# Patient Record
Sex: Female | Born: 1965
Health system: Southern US, Community
[De-identification: ages and names within clinical notes are randomized; demographics above are authoritative.]

## PROBLEM LIST (undated history)

## (undated) DIAGNOSIS — D649 Anemia, unspecified: Secondary | ICD-10-CM

## (undated) DIAGNOSIS — N950 Postmenopausal bleeding: Secondary | ICD-10-CM

## (undated) DIAGNOSIS — N840 Polyp of corpus uteri: Secondary | ICD-10-CM

---

## 2012-08-31 ENCOUNTER — Emergency Department (HOSPITAL_COMMUNITY): Payer: Self-pay

## 2012-08-31 ENCOUNTER — Emergency Department (HOSPITAL_COMMUNITY)
Admission: EM | Admit: 2012-08-31 | Discharge: 2012-08-31 | Disposition: A | Discharge status: Home / Self Care | Payer: Self-pay | Attending: Emergency Medicine | Admitting: Emergency Medicine

## 2012-08-31 ENCOUNTER — Encounter (HOSPITAL_COMMUNITY): Payer: Self-pay

## 2012-08-31 DIAGNOSIS — Y9301 Activity, walking, marching and hiking: Secondary | ICD-10-CM | POA: Insufficient documentation

## 2012-08-31 DIAGNOSIS — W010XXA Fall on same level from slipping, tripping and stumbling without subsequent striking against object, initial encounter: Secondary | ICD-10-CM | POA: Insufficient documentation

## 2012-08-31 DIAGNOSIS — D649 Anemia, unspecified: Secondary | ICD-10-CM | POA: Insufficient documentation

## 2012-08-31 DIAGNOSIS — Z79899 Other long term (current) drug therapy: Secondary | ICD-10-CM | POA: Insufficient documentation

## 2012-08-31 DIAGNOSIS — S8010XA Contusion of unspecified lower leg, initial encounter: Secondary | ICD-10-CM | POA: Insufficient documentation

## 2012-08-31 DIAGNOSIS — S8011XA Contusion of right lower leg, initial encounter: Secondary | ICD-10-CM

## 2012-08-31 DIAGNOSIS — Y929 Unspecified place or not applicable: Secondary | ICD-10-CM | POA: Insufficient documentation

## 2012-08-31 DIAGNOSIS — X500XXA Overexertion from strenuous movement or load, initial encounter: Secondary | ICD-10-CM | POA: Insufficient documentation

## 2012-08-31 HISTORY — DX: Anemia, unspecified: D64.9

## 2012-08-31 NOTE — ED Provider Notes (Signed)
CSN: [id number]     Arrival date & time 08/31/12  1034 History  First MD Initiated Contact with Patient 08/31/12 1049     Chief Complaint  Patient presents with  . Fall  . Leg Pain    Left lower leg and foot    HPI Comments: The patient was walking Thursday last week when she actually stumbled and tripped and fell. The nursing notes indicate that the patient injured her left leg however it is her right leg that is affected.  Patient states she stumbled and twisted her right ankle. She also landed directly against her right shin. Patient states she's had noted some bruising and swelling. She was trying to keep it elevated and thought that the pain and swelling would reduce. It has not really gotten any better. Patient denies any numbness or weakness. She denies any other injuries.  Patient is a 47 y.o. female presenting with fall and leg pain. The history is provided by the patient.  Fall This is a new problem. The problem occurs constantly. The problem has not changed since onset. Leg Pain   Past Medical History  Diagnosis Date  . Anemia    History reviewed. No pertinent past surgical history. History reviewed. No pertinent family history. History  Substance Use Topics  . Smoking status: Never Smoker   . Smokeless tobacco: Never Used  . Alcohol Use: 0.6 oz/week    1 Cans of beer per week     Comment: Pt states one beer once every 4 months   OB History   Grav Para Term Preterm Abortions TAB SAB Ect Mult Living                 Review of Systems  All other systems reviewed and are negative.    Allergies  Review of patient's allergies indicates no known allergies.  Home Medications   Current Outpatient Rx  Name  Route  Sig  Dispense  Refill  . ferrous sulfate 220 (44 FE) MG/5ML solution   Oral   Take 220 mg by mouth every other day.         . ibuprofen (ADVIL,MOTRIN) 200 MG tablet   Oral   Take 200 mg by mouth once.          BP 127/88  Pulse 65  Temp(Src)  98.9 F (37.2 C) (Oral)  Resp 18  SpO2 100%  LMP 08/28/2012 Physical Exam  Nursing note and vitals reviewed. Constitutional: She appears well-developed and well-nourished. No distress.  HENT:  Head: Normocephalic and atraumatic.  Right Ear: External ear normal.  Left Ear: External ear normal.  Eyes: Conjunctivae are normal. Right eye exhibits no discharge. Left eye exhibits no discharge. No scleral icterus.  Neck: Neck supple. No tracheal deviation present.  Cardiovascular: Normal rate.   Pulmonary/Chest: Effort normal. No stridor. No respiratory distress.  Musculoskeletal: She exhibits no edema.       Right ankle: Normal. She exhibits normal range of motion and no swelling.       Right lower leg: She exhibits bony tenderness and swelling.       Right foot: She exhibits tenderness and bony tenderness. She exhibits no swelling, no deformity and no laceration.  Ecchymosis noted the right anterior tib-fib region, no gross deformity, or edema; tenderness to palpation midfoot, no medial or lateral malleolar tenderness; no proximal fibula tenderness; distal neurovascular intact  Neurological: She is alert. Cranial nerve deficit: no gross deficits.  Skin: Skin is warm and dry. No  rash noted.  Psychiatric: She has a normal mood and affect.    ED Course   Procedures (including critical care time)  Labs Reviewed - No data to display Dg Tibia/fibula Right  08/31/2012   *RADIOLOGY REPORT*  Clinical Data: Fall.  Right lower leg pain.  RIGHT TIBIA AND FIBULA - 2 VIEW  Comparison: None.  Findings: Imaged bones, joints and soft tissues appear normal.  IMPRESSION: Negative exam.   Original Report Authenticated By: Holley Dexter, M.D.   Dg Foot Complete Right  08/31/2012   *RADIOLOGY REPORT*  Clinical Data: Fall with bruising around the ankle.  RIGHT FOOT COMPLETE - 3+ VIEW  Comparison: None.  Findings: Hallux valgus.  Minimal first metatarsal phalangeal joint osteoarthritis.  No definite  fracture.  Small calcaneal spurs.  IMPRESSION: No acute findings.   Original Report Authenticated By: Leanna Battles, M.D.   1. Contusion of leg, right, initial encounter     MDM  No sign of break or dislocation. Patient be placed in a splint for comfort.  Followup with orthopedist as needed if not improving within the week  Celene Kras, MD 08/31/12 1250

## 2012-08-31 NOTE — ED Notes (Signed)
Last Thursday 8/14, pt was walking down stairs, tripped and fell. Pt stated landed on left leg and immediantly began to swell. Swelling occurred in left lower portion including ankle and foot. Pt stated she has iced it off and on since Thursday and swelling has not gone down. Pt rates pain 0/10 when sitting and 7/10 when walking or pressure applied.

## 2013-08-11 ENCOUNTER — Emergency Department (HOSPITAL_COMMUNITY)
Admission: EM | Admit: 2013-08-11 | Discharge: 2013-08-11 | Disposition: A | Discharge status: Home / Self Care | Payer: Self-pay | Attending: Emergency Medicine | Admitting: Emergency Medicine

## 2013-08-11 ENCOUNTER — Encounter (HOSPITAL_COMMUNITY): Payer: Self-pay | Admitting: Emergency Medicine

## 2013-08-11 DIAGNOSIS — Y9289 Other specified places as the place of occurrence of the external cause: Secondary | ICD-10-CM | POA: Insufficient documentation

## 2013-08-11 DIAGNOSIS — Z23 Encounter for immunization: Secondary | ICD-10-CM | POA: Insufficient documentation

## 2013-08-11 DIAGNOSIS — Y9389 Activity, other specified: Secondary | ICD-10-CM | POA: Insufficient documentation

## 2013-08-11 DIAGNOSIS — Z79899 Other long term (current) drug therapy: Secondary | ICD-10-CM | POA: Insufficient documentation

## 2013-08-11 DIAGNOSIS — Z862 Personal history of diseases of the blood and blood-forming organs and certain disorders involving the immune mechanism: Secondary | ICD-10-CM | POA: Insufficient documentation

## 2013-08-11 DIAGNOSIS — S0120XA Unspecified open wound of nose, initial encounter: Secondary | ICD-10-CM | POA: Insufficient documentation

## 2013-08-11 DIAGNOSIS — W268XXA Contact with other sharp object(s), not elsewhere classified, initial encounter: Secondary | ICD-10-CM | POA: Insufficient documentation

## 2013-08-11 DIAGNOSIS — S0181XA Laceration without foreign body of other part of head, initial encounter: Secondary | ICD-10-CM

## 2013-08-11 MED ORDER — HYDROCODONE-ACETAMINOPHEN 5-325 MG PO TABS: 1.0000 | ORAL_TABLET | ORAL | Status: DC | PRN

## 2013-08-11 MED ORDER — AMOXICILLIN-POT CLAVULANATE 875-125 MG PO TABS: 1.0000 | ORAL_TABLET | Freq: Two times a day (BID) | ORAL | Status: DC

## 2013-08-11 MED ORDER — TETANUS-DIPHTH-ACELL PERTUSSIS 5-2.5-18.5 LF-MCG/0.5 IM SUSP
0.5000 mL | Freq: Once | INTRAMUSCULAR | Status: AC
  Administered 2013-08-11: 0.5 mL via INTRAMUSCULAR
  Filled 2013-08-11: qty 0.5

## 2013-08-11 MED ORDER — OXYCODONE-ACETAMINOPHEN 5-325 MG PO TABS
1.0000 | ORAL_TABLET | Freq: Once | ORAL | Status: AC
  Administered 2013-08-11: 1 via ORAL
  Filled 2013-08-11: qty 1

## 2013-08-11 MED ORDER — IBUPROFEN 800 MG PO TABS: 800.0000 mg | ORAL_TABLET | Freq: Three times a day (TID) | ORAL | Status: DC

## 2013-08-11 NOTE — ED Notes (Signed)
Consulting physician at bedside.

## 2013-08-11 NOTE — ED Provider Notes (Signed)
CSN: [id number]     Arrival date & time 08/11/13  1354 History   This chart was scribed for Harvie Heck, PA-C working with Richarda Blade, MD by Randa Evens, ED Scribe. This patient was seen in room TR10C/TR10C and the patient's care was started at 4:03 PM.      Chief Complaint  Patient presents with  . Facial Laceration   HPI Comments: Katherine Hicks is a 48 y.o. female who presents to the Emergency Department complaining of face laceration onset 2 HR prior to arrival. She states she is having an associated headache. She states she walked in to a piece of metal hanging off the back of a friends truck. She states her tetanus was in the 1980's. She denies LOC Pt is also asking for ibuprofen for unrelated neck pain.   The history is provided by the patient. No language interpreter was used.    Past Medical History  Diagnosis Date  . Anemia    History reviewed. No pertinent past surgical history. No family history on file. History  Substance Use Topics  . Smoking status: Never Smoker   . Smokeless tobacco: Never Used  . Alcohol Use: 0.6 oz/week    1 Cans of beer per week     Comment: Pt states one beer once every 4 months   OB History   Grav Para Term Preterm Abortions TAB SAB Ect Mult Living                 Review of Systems  Skin: Positive for wound.  Neurological: Positive for headaches. Negative for syncope.    Allergies  Review of patient's allergies indicates no known allergies.  Home Medications   Prior to Admission medications   Medication Sig Start Date End Date Taking? Authorizing Provider  Multiple Vitamin (MULTIVITAMIN WITH MINERALS) TABS tablet Take 1 tablet by mouth daily.   Yes Historical Provider, MD   Triage Vitals: BP 129/80  Pulse 56  Temp(Src) 98.3 F (36.8 C) (Oral)  Ht 5\' 6"  (1.676 m)  Wt 170 lb (77.111 kg)  BMI 27.45 kg/m2  SpO2 100%  Physical Exam  Nursing note and vitals reviewed. Constitutional: She is oriented to person,  place, and time. She appears well-developed and well-nourished.  Non-toxic appearance. She does not have a sickly appearance. She does not appear ill. No distress.  HENT:  Head: Normocephalic.  Nose: Nose lacerations present.  2.5 cm laceration to the Supratip break with the tip/apex slightly depressed. Minimal bleeding no obvious FB, Inspection of nare does not show through and through laceration.  Eyes: Conjunctivae and EOM are normal. Pupils are equal, round, and reactive to light.  Neck: Normal range of motion. Neck supple.  Pulmonary/Chest: Effort normal. No respiratory distress.  Musculoskeletal: Normal range of motion.  Neurological: She is alert and oriented to person, place, and time.  Skin: Skin is warm and dry. She is not diaphoretic.  Psychiatric: She has a normal mood and affect. Her behavior is normal.    ED Course  LACERATION REPAIR Date/Time: 08/11/2013 6:14 PM Performed by: Lorrine Kin Authorized by: Lorrine Kin Consent: Verbal consent obtained. Risks and benefits: risks, benefits and alternatives were discussed Consent given by: patient Patient understanding: patient states understanding of the procedure being performed Patient consent: the patient's understanding of the procedure matches consent given Required items: required blood products, implants, devices, and special equipment available Patient identity confirmed: verbally with patient Body area: head/neck Location details: nose Laceration length: 3  cm Foreign bodies: unknown (Small amount of dirt) Tendon involvement: none Anesthesia: local infiltration Local anesthetic: lidocaine 2% without epinephrine Anesthetic total: 4 ml Patient sedated: no Preparation: Patient was prepped and draped in the usual sterile fashion. Irrigation solution: saline Irrigation method: syringe Amount of cleaning: standard Skin closure: 6-0 Prolene Number of sutures: 4 Technique: simple Approximation:  close Approximation difficulty: simple Patient tolerance: Patient tolerated the procedure well with no immediate complications.   (including critical care time) DIAGNOSTIC STUDIES: Oxygen Saturation is 100% on RA, normal by my interpretation.    COORDINATION OF CARE: 4:10 PM-Discussed treatment plan which includes laceration repair with pt at bedside and pt agreed to plan.     Labs Review Labs Reviewed - No data to display  Imaging Review No results found.   EKG Interpretation None      MDM   Final diagnoses:  Facial laceration, initial encounter   Patient presents with laceration to the nose with minimal displacement. Discussed with Dr. Eulis Foster also evaluated patient in the emergency department and advises call to maxillofacial for recommendations on repair in the ED first stable repair here. Patient's last tetanus was in 1980, updated today. 1655 discussed patient history, laceration with Dr. Janace Hoard who agrees to evaluate the patient in the emergency department for recommendations and closure the laceration. Pt tolerated laceration repair without complaints. Meds given in ED:  Medications  Tdap (BOOSTRIX) injection 0.5 mL (0.5 mLs Intramuscular Given 08/11/13 1624)  oxyCODONE-acetaminophen (PERCOCET/ROXICET) 5-325 MG per tablet 1 tablet (1 tablet Oral Given 08/11/13 1624)    Discharge Medication List as of 08/11/2013  6:35 PM    START taking these medications   Details  amoxicillin-clavulanate (AUGMENTIN) 875-125 MG per tablet Take 1 tablet by mouth 2 (two) times daily., Starting 08/11/2013, Until Discontinued, Print    HYDROcodone-acetaminophen (NORCO/VICODIN) 5-325 MG per tablet Take 1 tablet by mouth every 4 (four) hours as needed., Starting 08/11/2013, Until Discontinued, Print    ibuprofen (ADVIL,MOTRIN) 800 MG tablet Take 1 tablet (800 mg total) by mouth 3 (three) times daily., Starting 08/11/2013, Until Discontinued, Print       I personally performed the services  described in this documentation, which was scribed in my presence. The recorded information has been reviewed and is accurate.     Lorrine Kin, PA-C 08/12/13 [phone]

## 2013-08-11 NOTE — ED Notes (Signed)
Patient states she ran into a piece of corrugated sheet metal and it cut open her nose.   Patient states medium size laceration on top of nose.  Patient has gauze that was placed by EMS on nose.  Bleeding controlled at this time.

## 2013-08-11 NOTE — Discharge Instructions (Signed)
Call Dr. Janace Hoard for further evaluation and suture removal in 5 days. Call for a follow up appointment with a Family or Primary Care Provider.  Return if Symptoms worsen.   Take medication as prescribed.  Keep the wound clean and dry.  Do not soak the wound.    Emergency Department Resource Guide 1) Find a Doctor and Pay Out of Pocket Although you won't have to find out who is covered by your insurance plan, it is a good idea to ask around and get recommendations. You will then need to call the office and see if the doctor you have chosen will accept you as a new patient and what types of options they offer for patients who are self-pay. Some doctors offer discounts or will set up payment plans for their patients who do not have insurance, but you will need to ask so you aren't surprised when you get to your appointment.  2) Contact Your Local Health Department Not all health departments have doctors that can see patients for sick visits, but many do, so it is worth a call to see if yours does. If you don't know where your local health department is, you can check in your phone book. The CDC also has a tool to help you locate your state's health department, and many state websites also have listings of all of their local health departments.  3) Find a [facility] If your illness is not likely to be very severe or complicated, you may want to try a walk in clinic. These are popping up all over the country in pharmacies, drugstores, and shopping centers. They're usually staffed by nurse practitioners or physician assistants that have been trained to treat common illnesses and complaints. They're usually fairly quick and inexpensive. However, if you have serious medical issues or chronic medical problems, these are probably not your best option.  No Primary Care Doctor: - Call Health Connect at  [phone] - they can help you locate a primary care doctor that  accepts your insurance, provides certain  services, etc. - Physician Referral Service- [phone]  Chronic Pain Problems: Organization         Address  Phone   Notes  [facility]  [phone] Patients need to be referred by their primary care doctor.   Medication Assistance: Organization         Address  Phone   Notes  [facility] Medication [facility] [address] [place], [place] [address] [phone] --Must be a resident of [facility] -- Must have NO insurance coverage whatsoever (no Medicaid/ Medicare, etc.) -- The pt. MUST have a primary care doctor that directs their care regularly and follows them in the community   MedAssist  [phone]   [employer]  [phone]    Agencies that provide inexpensive medical care: Organization         Address  Phone   Notes  [place]  [phone]   [facility]    [phone]   [facility] [place], [place] [address] [phone]   [place] [address] [place] [phone]   Planned Parenthood    [phone]   [facility]    [phone]   [place] and [address], [place] Phone:  [phone], Fax:  [phone] Hours of Operation:  9  am - 6 pm, M-F.  Also accepts Medicaid/Medicare and self-pay.  [facility] for [place] [address], [address], [place] Phone: [phone], Fax: [phone]. Hours of Operation:  8:30 am - 5:30 pm, M-F.  Also accepts Medicaid and self-pay.  [facility] [place] [address] [place] Phone: [phone]   [place], [place], [place] [phone], Ext. [phone] Mondays & Thursdays: 7-9 AM.  First 15 patients are seen on a first come, first serve basis.    [place] Providers:  Organization         Address  Phone   Notes  [facility] [address], [place] [phone] Also accepts self-pay patients.  [facility] [place] [address], [place]  [phone]   [place], [address], [place] [phone]   [facility] [address] [place] [phone]   Lucianne Lei [address], [place]   [phone] Only accepts [place] Access [place] patients after they have their name applied to their card.   Self-Pay (no insurance) in [facility]:  Organization         Address  Phone   Notes  Sickle Cell Patients, [facility] [place] [phone]   [facility] [place] [phone]   [facility] [place]  [place], [place], [place] [phone]   [facility]/Dr. Osei-Bonsu  [address], [place] or [address], [place] [phone] Phone number for both [place] and [place] locations is the same.  [facility] [address] [place] [phone]   [facility] [address], [place] or [address] [phone]   [facility] [address], [place] [phone], phone; [phone], fax Sees patients 1st and 3rd Saturday of every month.  Must not qualify for public or private insurance (i.e. Medicaid, Medicare, [place] Health Choice, Veterans' Benefits)  Household income should be no more than 200% of the poverty level The clinic cannot treat you if you are pregnant or think you are pregnant  Sexually transmitted diseases are not treated at the clinic.    Dental Care: Organization         Address  Phone  Notes  [facility] [place] [phone] Accepts  children up to age 31 who are enrolled in [place] or [place]; pregnant women with a Medicaid card; and children who have applied for Medicaid or [place] Health Choice, but were declined, whose parents can pay a reduced fee at time of service.  [facility]  [address], [place] [phone] Accepts children up to age 63 who are enrolled in [place] or [place]; pregnant women with a Medicaid card; and children who have applied for Medicaid or [place] Health Choice, but were declined, whose parents can pay a reduced fee at time of service.  [place] Adult Dental Access PROGRAM  [place] [phone] Patients are seen by appointment only. Walk-ins are not accepted. [place] will see patients 18 years of  age and older. Monday - Tuesday (8am-5pm) Most Wednesdays (8:30-5pm) $30 per visit, cash only  [facility] Adult Dental Access PROGRAM  [address], [facility] [phone] Patients are seen by appointment only. Walk-ins are not accepted. [place] will see patients 61 years of age and older. One Wednesday Evening (Monthly: Volunteer Based).  $30 per visit, cash only  [place]  [phone] for adults; Children under age 52, call Graduate Pediatric Dentistry at [phone]. Children aged 59-14, please call [phone] to request a pediatric application.  Dental services are provided in all areas of dental care including fillings, crowns and bridges, complete and partial dentures, implants, gum treatment, root canals, and extractions. Preventive care is also provided. Treatment is provided to both adults and children. Patients are selected via a lottery and there is often a waiting list.   [facility] [address] [place]  [phone] [email or web address]   [facility] [address] [place], [place] [phone], Ext. [phone] Second and  Fourth Thursday of each month, opens at 6:30 AM; Clinic ends at 9 AM.  Patients are seen on a first-come first-served basis, and a limited number are seen during each clinic.   [facility]  [address] Hillard Danker [place], [place] [phone]   Eligibility Requirements You must have lived in [place], [place], or [place] counties for at least the last three months.   You cannot be eligible for state or federal sponsored [employer], including [employer], [place], or [employer].   You generally cannot be eligible for healthcare insurance through your employer.    How to apply: Eligibility screenings are held every Tuesday and Wednesday afternoon from 1:00 pm until 4:00 pm. You do not need an appointment for the interview!  [facility] [address] [place], [place]   [facility]  [place] Department  [place]  [phone]    Behavioral Health Resources in the Community: Intensive Outpatient Programs Organization         Address  Phone  Notes  [place]. [address], [place], [place] [phone]   [facility] [address], [place], [place]   ADS: Alcohol & Drug Svcs [address], [place], [place]   [place] [address]  [place], [place] or [phone]   Substance Abuse Resources Organization         Address  Phone  Notes  Alcohol and Drug Services  [phone]   [place]  [phone]   [facility] [place]   Chinita Pester  [phone]   Residential & Outpatient Substance Abuse Program  [phone]   Psychological Services Organization         Address  Phone  Notes  [facility] [place]  [place]  [phone]   [place]  [address], [place] or [phone]    Mobile Crisis Teams Organization         Address  Phone  Notes  [facility], Mobile Crisis Care Unit  [phone]   Assertive Psychotherapeutic Services  [address] [place], [place]   [facility] [address] [place] [phone]    Self-Help/Support Groups Organization  Address  Phone             Notes  [place]. of [place] - variety of support groups  [place] Call for more information  Narcotics Anonymous (NA), [facility] [address], [employer] [place]  2 meetings at this location   Special educational needs teacher         Address  Phone  Notes  [facility] Residential [facility],    [place]  [phone]   [facility]  [address], [place] [phone], [place], [place]   [place], [place] [phone] Admissions: 8am-3pm M-F  Incentives Substance [place] [address]    [place], [place] [phone]   [facility] [address] [place], [place], [place]   The [facility] [address],  [place], [place]   [facility] Programs - Intensive Outpatient [address] Kristeen [address], [place], [place]   [facility] ([place] [address]  [place], [place] [phone] or [phone]   [facility] Services (RTS) [address] [place], [place] Accepts Medicaid  [facility] [place] [address]  [place] [phone] Substance Abuse/Addiction Treatment   [facility] Organization         Address  Phone  Notes  [facility]  [phone]   Domenic Schwab, PhD [address] [place], [place]   [phone] or [phone]   [place], [place] [phone]   [facility] [address], [place], [place] [phone] Insurance/Medicaid/sponsorship through [facility] and Families [address], [address] [place]                                    [place], [place] [phone] [place] [address], [place] [phone]    Dr. Adele Schilder  [phone]   [facility] [place] Dept. 1) [address] [place] 2) [place] 3)  [place] [address], [place] [phone]  [phone]   [place] [phone] or [phone] (After Hours)

## 2013-08-11 NOTE — Consult Note (Signed)
  Asked to see the patient for a nasal laceration for recommendation of any need for exploration of the wound for cartilage injury . Patient had an injury today running into some corrugated metal roofing and cut her nose on the dorsum just posterior to the dome. She had some bleeding but that has currently stopped. She had no bleeding from the inside of the nose. She has no new nasal obstruction. The wound is for the most part approximated. Her pain is controlled. She has no other injuries. Examination: Awake and alert. Nose the dorsum has a transverse wound that is approximately 1 cm. His just behind the dome of the cartilage of the lower lateral cartilages. It looks like it's posterior to these cartilages. It is not clear as to whether it  the injured the superior aspect of the cartilaginous septum. There is no evidence of injury inside the nose and no blood. No swelling. The septum is midline. The nose appears to have good columellar support. Oral cavity/oropharynx-no lesions. Neck without adenopathy. Nasal laceration-this does not appear to have any obvious complexity to the injury requiring further intervention such as exploration of the cartilage. The wound is fairly well approximated already and I would recommend closure with chromic and then possibly Dermabond. She should followup in the office in about 5-7 days. It is unlikely that any further intervention will be necessary but I think intervention at this time to explore for any injuries would be more detrimental  than to let it heal as is. I will place her on Keflex or clindamycin.

## 2013-08-13 NOTE — ED Provider Notes (Signed)
Medical screening examination/treatment/procedure(s) were performed by non-physician practitioner and as supervising physician I was immediately available for consultation/collaboration.  Richarda Blade, MD 08/13/13 Dyann Kief

## 2014-07-17 IMAGING — CR DG FOOT COMPLETE 3+V*R*
3 series · 3 of 3 positions shown · non-contrast
Comparison: None.

CLINICAL DATA: Fall with bruising around the ankle.

RIGHT FOOT COMPLETE - 3+ VIEW

[x foot ap right]
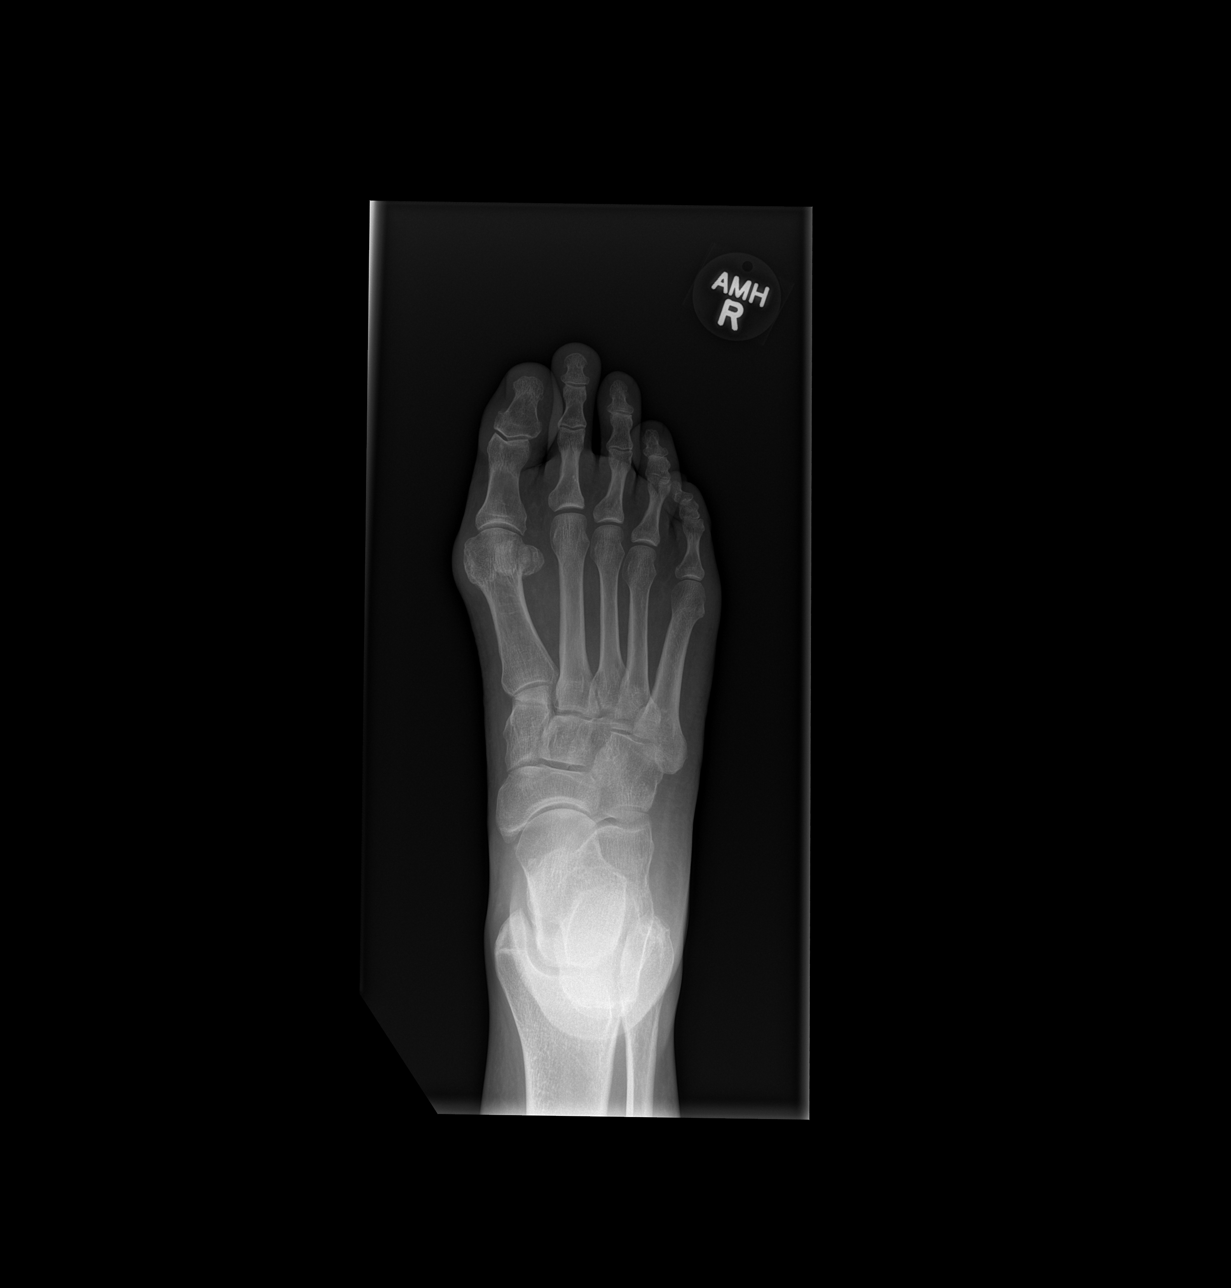

[x foot obl right]
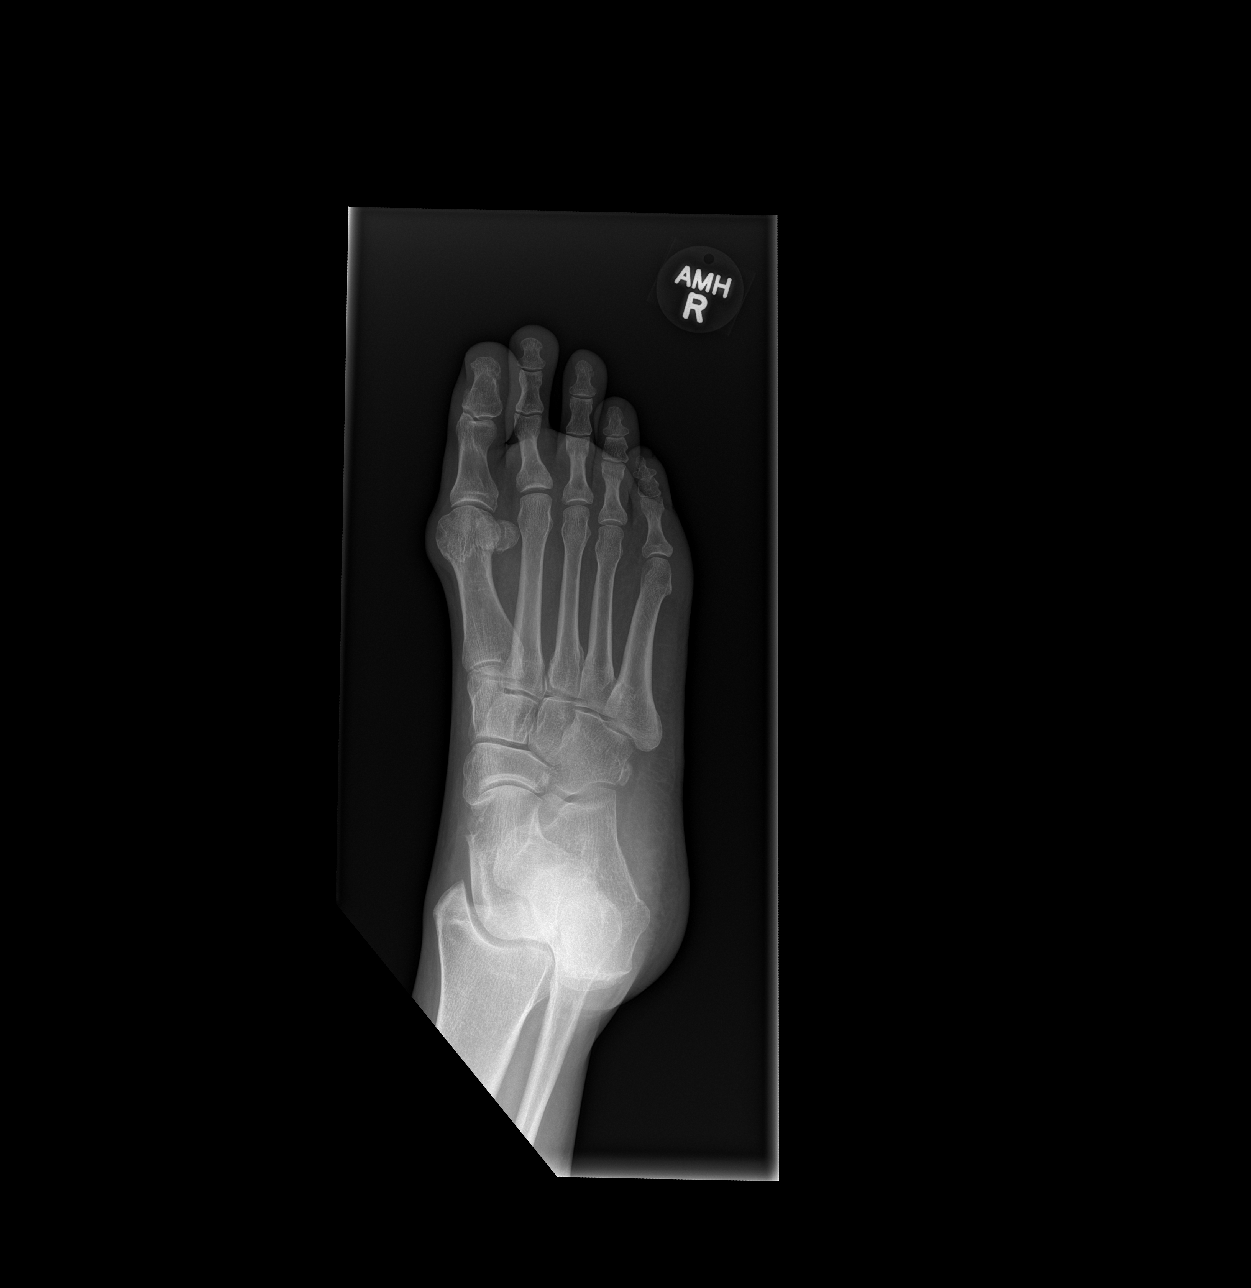

[x foot lat right]
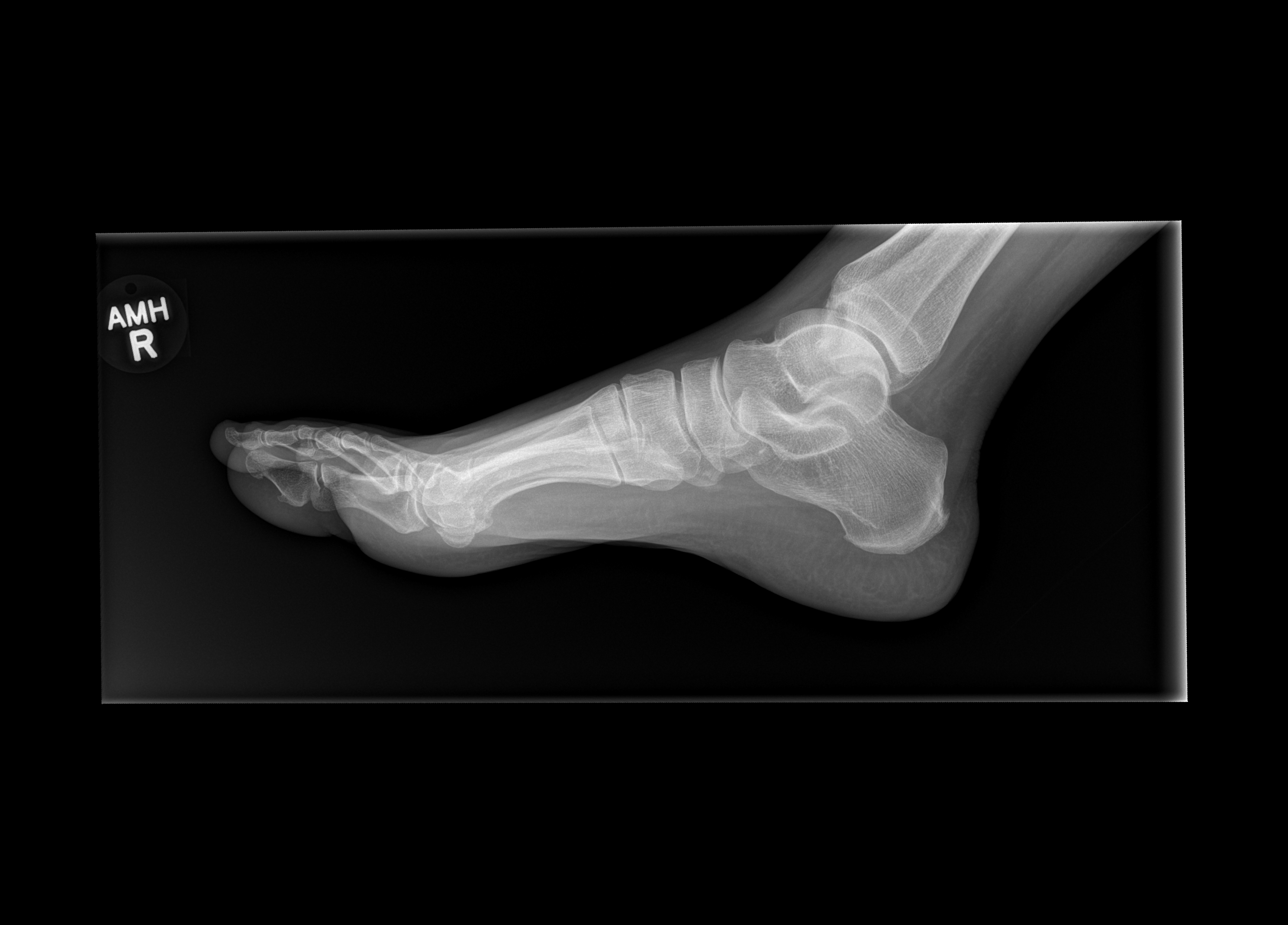

[3 of 3 positions shown; findings below may reference images not displayed]

FINDINGS: Hallux valgus.  Minimal first metatarsal phalangeal joint
osteoarthritis.  No definite fracture.  Small calcaneal spurs.
IMPRESSION: No acute findings.

## 2015-12-20 DIAGNOSIS — M9905 Segmental and somatic dysfunction of pelvic region: Secondary | ICD-10-CM | POA: Diagnosis not present

## 2015-12-20 DIAGNOSIS — M5137 Other intervertebral disc degeneration, lumbosacral region: Secondary | ICD-10-CM | POA: Diagnosis not present

## 2015-12-20 DIAGNOSIS — M5032 Other cervical disc degeneration, mid-cervical region, unspecified level: Secondary | ICD-10-CM | POA: Diagnosis not present

## 2015-12-20 DIAGNOSIS — M9903 Segmental and somatic dysfunction of lumbar region: Secondary | ICD-10-CM | POA: Diagnosis not present

## 2015-12-21 DIAGNOSIS — M9903 Segmental and somatic dysfunction of lumbar region: Secondary | ICD-10-CM | POA: Diagnosis not present

## 2015-12-21 DIAGNOSIS — M5032 Other cervical disc degeneration, mid-cervical region, unspecified level: Secondary | ICD-10-CM | POA: Diagnosis not present

## 2015-12-21 DIAGNOSIS — M9905 Segmental and somatic dysfunction of pelvic region: Secondary | ICD-10-CM | POA: Diagnosis not present

## 2015-12-21 DIAGNOSIS — M5137 Other intervertebral disc degeneration, lumbosacral region: Secondary | ICD-10-CM | POA: Diagnosis not present

## 2015-12-25 DIAGNOSIS — M5032 Other cervical disc degeneration, mid-cervical region, unspecified level: Secondary | ICD-10-CM | POA: Diagnosis not present

## 2015-12-25 DIAGNOSIS — M9903 Segmental and somatic dysfunction of lumbar region: Secondary | ICD-10-CM | POA: Diagnosis not present

## 2015-12-25 DIAGNOSIS — M5137 Other intervertebral disc degeneration, lumbosacral region: Secondary | ICD-10-CM | POA: Diagnosis not present

## 2015-12-25 DIAGNOSIS — M9905 Segmental and somatic dysfunction of pelvic region: Secondary | ICD-10-CM | POA: Diagnosis not present

## 2015-12-27 DIAGNOSIS — M9905 Segmental and somatic dysfunction of pelvic region: Secondary | ICD-10-CM | POA: Diagnosis not present

## 2015-12-27 DIAGNOSIS — M5032 Other cervical disc degeneration, mid-cervical region, unspecified level: Secondary | ICD-10-CM | POA: Diagnosis not present

## 2015-12-27 DIAGNOSIS — M5137 Other intervertebral disc degeneration, lumbosacral region: Secondary | ICD-10-CM | POA: Diagnosis not present

## 2015-12-27 DIAGNOSIS — M9903 Segmental and somatic dysfunction of lumbar region: Secondary | ICD-10-CM | POA: Diagnosis not present

## 2016-01-01 DIAGNOSIS — M5137 Other intervertebral disc degeneration, lumbosacral region: Secondary | ICD-10-CM | POA: Diagnosis not present

## 2016-01-01 DIAGNOSIS — M9903 Segmental and somatic dysfunction of lumbar region: Secondary | ICD-10-CM | POA: Diagnosis not present

## 2016-01-01 DIAGNOSIS — M5032 Other cervical disc degeneration, mid-cervical region, unspecified level: Secondary | ICD-10-CM | POA: Diagnosis not present

## 2016-01-01 DIAGNOSIS — M9905 Segmental and somatic dysfunction of pelvic region: Secondary | ICD-10-CM | POA: Diagnosis not present

## 2016-01-03 DIAGNOSIS — M5032 Other cervical disc degeneration, mid-cervical region, unspecified level: Secondary | ICD-10-CM | POA: Diagnosis not present

## 2016-01-03 DIAGNOSIS — M9905 Segmental and somatic dysfunction of pelvic region: Secondary | ICD-10-CM | POA: Diagnosis not present

## 2016-01-03 DIAGNOSIS — M9903 Segmental and somatic dysfunction of lumbar region: Secondary | ICD-10-CM | POA: Diagnosis not present

## 2016-01-03 DIAGNOSIS — M5137 Other intervertebral disc degeneration, lumbosacral region: Secondary | ICD-10-CM | POA: Diagnosis not present

## 2016-01-10 DIAGNOSIS — M5137 Other intervertebral disc degeneration, lumbosacral region: Secondary | ICD-10-CM | POA: Diagnosis not present

## 2016-01-10 DIAGNOSIS — M9903 Segmental and somatic dysfunction of lumbar region: Secondary | ICD-10-CM | POA: Diagnosis not present

## 2016-01-10 DIAGNOSIS — M9905 Segmental and somatic dysfunction of pelvic region: Secondary | ICD-10-CM | POA: Diagnosis not present

## 2016-01-10 DIAGNOSIS — M5032 Other cervical disc degeneration, mid-cervical region, unspecified level: Secondary | ICD-10-CM | POA: Diagnosis not present

## 2016-01-12 DIAGNOSIS — M5032 Other cervical disc degeneration, mid-cervical region, unspecified level: Secondary | ICD-10-CM | POA: Diagnosis not present

## 2016-01-12 DIAGNOSIS — M5137 Other intervertebral disc degeneration, lumbosacral region: Secondary | ICD-10-CM | POA: Diagnosis not present

## 2016-01-12 DIAGNOSIS — M9905 Segmental and somatic dysfunction of pelvic region: Secondary | ICD-10-CM | POA: Diagnosis not present

## 2016-01-12 DIAGNOSIS — M9903 Segmental and somatic dysfunction of lumbar region: Secondary | ICD-10-CM | POA: Diagnosis not present

## 2016-01-17 DIAGNOSIS — M9905 Segmental and somatic dysfunction of pelvic region: Secondary | ICD-10-CM | POA: Diagnosis not present

## 2016-01-17 DIAGNOSIS — M9903 Segmental and somatic dysfunction of lumbar region: Secondary | ICD-10-CM | POA: Diagnosis not present

## 2016-01-17 DIAGNOSIS — M5032 Other cervical disc degeneration, mid-cervical region, unspecified level: Secondary | ICD-10-CM | POA: Diagnosis not present

## 2016-01-17 DIAGNOSIS — M5137 Other intervertebral disc degeneration, lumbosacral region: Secondary | ICD-10-CM | POA: Diagnosis not present

## 2016-02-07 DIAGNOSIS — M9903 Segmental and somatic dysfunction of lumbar region: Secondary | ICD-10-CM | POA: Diagnosis not present

## 2016-02-07 DIAGNOSIS — M5137 Other intervertebral disc degeneration, lumbosacral region: Secondary | ICD-10-CM | POA: Diagnosis not present

## 2016-02-07 DIAGNOSIS — M9905 Segmental and somatic dysfunction of pelvic region: Secondary | ICD-10-CM | POA: Diagnosis not present

## 2016-02-07 DIAGNOSIS — M5032 Other cervical disc degeneration, mid-cervical region, unspecified level: Secondary | ICD-10-CM | POA: Diagnosis not present

## 2016-02-14 DIAGNOSIS — M5137 Other intervertebral disc degeneration, lumbosacral region: Secondary | ICD-10-CM | POA: Diagnosis not present

## 2016-02-14 DIAGNOSIS — M5032 Other cervical disc degeneration, mid-cervical region, unspecified level: Secondary | ICD-10-CM | POA: Diagnosis not present

## 2016-02-14 DIAGNOSIS — M9905 Segmental and somatic dysfunction of pelvic region: Secondary | ICD-10-CM | POA: Diagnosis not present

## 2016-02-14 DIAGNOSIS — M9903 Segmental and somatic dysfunction of lumbar region: Secondary | ICD-10-CM | POA: Diagnosis not present

## 2016-06-07 ENCOUNTER — Ambulatory Visit (INDEPENDENT_AMBULATORY_CARE_PROVIDER_SITE_OTHER): Payer: BLUE CROSS/BLUE SHIELD | Admitting: Family Medicine

## 2016-06-07 ENCOUNTER — Encounter: Payer: Self-pay | Admitting: Family Medicine

## 2016-06-07 ENCOUNTER — Telehealth: Payer: Self-pay

## 2016-06-07 VITALS — BP 134/80 | HR 55 | Temp 98.0°F | Ht 66.0 in | Wt 185.2 lb

## 2016-06-07 DIAGNOSIS — F410 Panic disorder [episodic paroxysmal anxiety] without agoraphobia: Secondary | ICD-10-CM | POA: Diagnosis not present

## 2016-06-07 DIAGNOSIS — Z114 Encounter for screening for human immunodeficiency virus [HIV]: Secondary | ICD-10-CM | POA: Diagnosis not present

## 2016-06-07 DIAGNOSIS — Z1231 Encounter for screening mammogram for malignant neoplasm of breast: Secondary | ICD-10-CM | POA: Diagnosis not present

## 2016-06-07 DIAGNOSIS — Z0000 Encounter for general adult medical examination without abnormal findings: Secondary | ICD-10-CM | POA: Diagnosis not present

## 2016-06-07 DIAGNOSIS — Z1159 Encounter for screening for other viral diseases: Secondary | ICD-10-CM | POA: Diagnosis not present

## 2016-06-07 DIAGNOSIS — Z13220 Encounter for screening for lipoid disorders: Secondary | ICD-10-CM

## 2016-06-07 DIAGNOSIS — D649 Anemia, unspecified: Secondary | ICD-10-CM

## 2016-06-07 LAB — CBC WITH DIFFERENTIAL/PLATELET
Basophils Absolute: 0.1 10*3/uL (ref 0.0–0.1)
Basophils Relative: 1.2 % (ref 0.0–3.0)
Eosinophils Absolute: 0.2 10*3/uL (ref 0.0–0.7)
Eosinophils Relative: 3.9 % (ref 0.0–5.0)
HCT: 34.9 % — ABNORMAL LOW (ref 36.0–46.0)
Hemoglobin: 11.1 g/dL — ABNORMAL LOW (ref 12.0–15.0)
Lymphocytes Relative: 23.1 % (ref 12.0–46.0)
Lymphs Abs: 1.3 10*3/uL (ref 0.7–4.0)
MCHC: 31.9 g/dL (ref 30.0–36.0)
MCV: 74.9 fl — ABNORMAL LOW (ref 78.0–100.0)
Monocytes Absolute: 0.5 10*3/uL (ref 0.1–1.0)
Monocytes Relative: 8.5 % (ref 3.0–12.0)
Neutro Abs: 3.7 10*3/uL (ref 1.4–7.7)
Neutrophils Relative %: 63.3 % (ref 43.0–77.0)
Platelets: 310 10*3/uL (ref 150.0–400.0)
RBC: 4.66 Mil/uL (ref 3.87–5.11)
RDW: 17.2 % — ABNORMAL HIGH (ref 11.5–15.5)
WBC: 5.8 10*3/uL (ref 4.0–10.5)

## 2016-06-07 LAB — LIPID PANEL
Cholesterol: 208 mg/dL — ABNORMAL HIGH (ref 0–200)
HDL: 48.3 mg/dL (ref >39.00)
LDL Cholesterol: 137 mg/dL — ABNORMAL HIGH (ref 0–99)
NonHDL: 159.35
Total CHOL/HDL Ratio: 4
Triglycerides: 114 mg/dL (ref 0.0–149.0)
VLDL: 22.8 mg/dL (ref 0.0–40.0)

## 2016-06-07 LAB — COMPREHENSIVE METABOLIC PANEL
ALT: 18 U/L (ref 0–35)
AST: 23 U/L (ref 0–37)
Albumin: 4.3 g/dL (ref 3.5–5.2)
Alkaline Phosphatase: 67 U/L (ref 39–117)
BUN: 11 mg/dL (ref 6–23)
CO2: 29 mEq/L (ref 19–32)
Calcium: 9.6 mg/dL (ref 8.4–10.5)
Chloride: 105 mEq/L (ref 96–112)
Creatinine, Ser: 0.74 mg/dL (ref 0.40–1.20)
GFR: 87.43 mL/min (ref >60.00)
Glucose, Bld: 89 mg/dL (ref 70–99)
Potassium: 4.1 mEq/L (ref 3.5–5.1)
Sodium: 140 mEq/L (ref 135–145)
Total Bilirubin: 0.5 mg/dL (ref 0.2–1.2)
Total Protein: 7 g/dL (ref 6.0–8.3)

## 2016-06-07 MED ORDER — DIAZEPAM 2 MG PO TABS: ORAL_TABLET | ORAL | 0 refills | Status: DC

## 2016-06-07 NOTE — Telephone Encounter (Signed)
Controlled substance:  diazepam  Pharmacy: [facility]  Status (No new findings/new findings):  No new findings  New Findings (if applicable):    Additional Comments:  Patient has no controlled subs on database review.    **New findings routed to provider**

## 2016-06-07 NOTE — Progress Notes (Signed)
Subjective:  Water quality scientist, CMA, acting as scribe for Dr. Juleen China.  Katherine Hicks is a 51 y.o. female and is here for a comprehensive physical exam.   HPI: Patient comes in to establish care.  States she recently had a panic attack while at the dentist.  She is asking for something to take prior to going back to the dentist.  No other concerns or complaints.  Patient has not seen a physician in several years.  Declined Pap smear today.  No known allergies.  Patient is not taking any medications.  Health Maintenance Due  Topic Date Due  . PAP SMEAR  01/22/2013  . MAMMOGRAM  12/08/2013  . COLONOSCOPY  12/08/2013   PMHx, SurgHx, SocialHx, Medications, and Allergies were reviewed in the Visit Navigator and updated as appropriate.   Past Medical History:  Diagnosis Date  . Anemia    History reviewed. No pertinent surgical history.  Family History  Problem Relation Age of Onset  . Hypertension Mother   . Stroke Mother   . Cancer Father   . Diabetes Brother   . Hyperlipidemia Brother   . Hypertension Brother   . Stroke Brother   . Heart disease Maternal Grandmother   . Hypertension Maternal Grandmother   . Stroke Maternal Grandmother   . Hypertension Paternal Grandmother   . Stroke Paternal Grandmother   . Hypertension Paternal Grandfather   . Stroke Paternal Grandfather   . Stroke Brother    Social History  Substance Use Topics  . Smoking status: Never Smoker  . Smokeless tobacco: Never Used  . Alcohol use 1.8 oz/week    3 Cans of beer per week   Review of Systems:   Review of Systems  Constitutional: Negative for chills, fever, malaise/fatigue and weight loss.  Respiratory: Negative for cough, shortness of breath and wheezing.   Cardiovascular: Negative for chest pain, palpitations and leg swelling.  Gastrointestinal: Negative for abdominal pain, constipation, diarrhea, nausea and vomiting.  Genitourinary: Negative for dysuria and urgency.  Musculoskeletal:  Negative for joint pain and myalgias.  Skin: Negative for rash.  Neurological: Negative for dizziness and headaches.  Psychiatric/Behavioral: Negative for depression, substance abuse and suicidal ideas. The patient is not nervous/anxious.    Objective:   BP 134/80   Pulse (!) 55   Temp 98 F (36.7 C) (Oral)   Ht 5\' 6"  (1.676 m)   Wt 185 lb 3.2 oz (84 kg)   SpO2 99%   BMI 29.89 kg/m  Body mass index is 29.89 kg/m.   General Appearance:    Alert, cooperative, no distress, appears stated age  Head:    Normocephalic, without obvious abnormality, atraumatic  Eyes:    PERRL, conjunctiva/corneas clear, EOM's intact, fundi benign, both eyes  Ears:    Normal TM's and external ear canals, both ears  Nose:   Nares normal, septum midline, mucosa normal, no drainage or sinus tenderness  Throat:   Lips, mucosa, and tongue normal; teeth and gums normal  Neck:   Supple, symmetrical, trachea midline, no adenopathy; thyroid: no enlargement  Back:     Symmetric, no curvature, ROM normal, no CVA tenderness  Lungs:     Clear to auscultation bilaterally, respirations unlabored  Chest Wall:    No tenderness or deformity   Heart:    Regular rate and rhythm, S1 and S2 normal, no murmur, rub or gallop  Abdomen:     Soft, non-tender, bowel sounds active all four quadrants, no masses, no organomegaly  Extremities:  Extremities normal, atraumatic, no cyanosis or edema  Pulses:   2+ and symmetric all extremities  Skin:   Skin color, texture, turgor normal, no rashes or lesions  Lymph nodes:   Cervical, supraclavicular, and axillary nodes normal  Neurologic:   CNII-XII intact, normal strength, sensation and reflexes throughout     Results for orders placed or performed in visit on 06/07/16  HIV antibody  Result Value Ref Range   HIV 1&2 Ab, 4th Generation NONREACTIVE NONREACTIVE  Hepatitis C antibody  Result Value Ref Range   HCV Ab NEGATIVE NEGATIVE  CBC with Differential/Platelet  Result Value Ref  Range   WBC 5.8 4.0 - 10.5 K/uL   RBC 4.66 3.87 - 5.11 Mil/uL   Hemoglobin 11.1 (L) 12.0 - 15.0 g/dL   HCT 34.9 (L) 36.0 - 46.0 %   MCV 74.9 (L) 78.0 - 100.0 fl   MCHC 31.9 30.0 - 36.0 g/dL   RDW 17.2 (H) 11.5 - 15.5 %   Platelets 310.0 150.0 - 400.0 K/uL   Neutrophils Relative % 63.3 43.0 - 77.0 %   Lymphocytes Relative 23.1 12.0 - 46.0 %   Monocytes Relative 8.5 3.0 - 12.0 %   Eosinophils Relative 3.9 0.0 - 5.0 %   Basophils Relative 1.2 0.0 - 3.0 %   Neutro Abs 3.7 1.4 - 7.7 K/uL   Lymphs Abs 1.3 0.7 - 4.0 K/uL   Monocytes Absolute 0.5 0.1 - 1.0 K/uL   Eosinophils Absolute 0.2 0.0 - 0.7 K/uL   Basophils Absolute 0.1 0.0 - 0.1 K/uL  Comprehensive metabolic panel  Result Value Ref Range   Sodium 140 135 - 145 mEq/L   Potassium 4.1 3.5 - 5.1 mEq/L   Chloride 105 96 - 112 mEq/L   CO2 29 19 - 32 mEq/L   Glucose, Bld 89 70 - 99 mg/dL   BUN 11 6 - 23 mg/dL   Creatinine, Ser 0.74 0.40 - 1.20 mg/dL   Total Bilirubin 0.5 0.2 - 1.2 mg/dL   Alkaline Phosphatase 67 39 - 117 U/L   AST 23 0 - 37 U/L   ALT 18 0 - 35 U/L   Total Protein 7.0 6.0 - 8.3 g/dL   Albumin 4.3 3.5 - 5.2 g/dL   Calcium 9.6 8.4 - 10.5 mg/dL   GFR 87.43 >60.00 mL/min  Lipid panel  Result Value Ref Range   Cholesterol 208 (H) 0 - 200 mg/dL   Triglycerides 114.0 0.0 - 149.0 mg/dL   HDL 48.30 >39.00 mg/dL   VLDL 22.8 0.0 - 40.0 mg/dL   LDL Cholesterol 137 (H) 0 - 99 mg/dL   Total CHOL/HDL Ratio 4    NonHDL 159.35     Assessment/Plan:   Katherine Hicks was seen today for establish care.  Diagnoses and all orders for this visit:  Routine physical examination -     CBC with Differential/Platelet -     Comprehensive metabolic panel -     Lipid panel -     Ambulatory referral to Gastroenterology  Panic attack Comments: Valium provided for her next two dental visits.  Orders: -     diazepam (VALIUM) 2 MG tablet; Take 1 tablet 30 minutes prior to dental appointment  Encounter for screening mammogram for malignant  neoplasm of breast -     MM Digital Screening; Future  Encounter for screening for other viral diseases -     Hepatitis C antibody  Encounter for screening for HIV -     HIV antibody  Screening for lipid disorders -     Lipid panel  Anemia, unspecified type Comments: Microcytic. Will recommend iron supplementation and recheck in 3 months.    Patient Counseling: [x]    Nutrition: Stressed importance of moderation in sodium/caffeine intake, saturated fat and cholesterol, caloric balance, sufficient intake of fresh fruits, vegetables, fiber, calcium, iron, and 1 mg of folate supplement per day (for females capable of pregnancy).  [x]    Stressed the importance of regular exercise.   [x]    Substance Abuse: Discussed cessation/primary prevention of tobacco, alcohol, or other drug use; driving or other dangerous activities under the influence; availability of treatment for abuse.   [x]    Injury prevention: Discussed safety belts, safety helmets, smoke detector, smoking near bedding or upholstery.   [x]    Sexuality: Discussed sexually transmitted diseases, partner selection, use of condoms, avoidance of unintended pregnancy  and contraceptive alternatives.  [x]    Dental health: Discussed importance of regular tooth brushing, flossing, and dental visits.  [x]    Health maintenance and immunizations reviewed. Please refer to Health maintenance section.   Briscoe Deutscher, DO [facility] served as Education administrator during this visit. History, Physical, and Plan performed by medical provider. The above documentation has been reviewed and is accurate and complete. Briscoe Deutscher, D.O.

## 2016-06-07 NOTE — Progress Notes (Deleted)
   Katherine Hicks is a 51 y.o. female is here to [place].   Patient Care Team: Briscoe Deutscher, DO as PCP - General (Family Medicine)   History of Present Illness:   Katherine Hicks, CMA, acting as scribe for Dr. Juleen China.  CC:  Patient comes in to establish care.  States she recently had a panic attack while at the dentist.  She is asking for something to take prior to going back to the dentist.  No other concerns or complaints.  Patient has not seen a physician in several years.  Declined Pap smear today.  No known allergies.  Patient is not taking any medications.  HPI  Health Maintenance Due  Topic Date Due  . Hepatitis C Screening  [birth year]  . PAP SMEAR  01/22/2013  . MAMMOGRAM  12/08/2013  . COLONOSCOPY  12/08/2013    VACCINATION STATUS: Immunization History  Administered Date(s) Administered  . Tdap 08/11/2013    PMHx, SurgHx, SocialHx, Medications, and Allergies were reviewed in the Visit Navigator and updated as appropriate.   Past Medical History:  Diagnosis Date  . Anemia    History reviewed. No pertinent surgical history.  Family History  Problem Relation Age of Onset  . Hypertension Mother   . Stroke Mother   . Cancer Father   . Diabetes Brother   . Hyperlipidemia Brother   . Hypertension Brother   . Stroke Brother   . Heart disease Maternal Grandmother   . Hypertension Maternal Grandmother   . Stroke Maternal Grandmother   . Hypertension Paternal Grandmother   . Stroke Paternal Grandmother   . Hypertension Paternal Grandfather   . Stroke Paternal Grandfather   . Stroke Brother    Social History  Substance Use Topics  . Smoking status: Never Smoker  . Smokeless tobacco: Never Used  . Alcohol use 1.8 oz/week    3 Cans of beer per week    Current Medications and Allergies:  No current outpatient prescriptions on file. No Known Allergies Review of Systems:   Review of Systems  Constitutional: Negative for chills, fever and  malaise/fatigue.  HENT: Negative for congestion, ear pain and sore throat.   Eyes: Negative for blurred vision and pain.  Respiratory: Negative for cough and shortness of breath.   Cardiovascular: Negative for chest pain and palpitations.  Gastrointestinal: Negative for abdominal pain, nausea and vomiting.  Genitourinary: Negative for frequency.  Musculoskeletal: Negative for back pain and neck pain.  Skin: Negative for rash.  Neurological: Negative for dizziness, loss of consciousness, weakness and headaches.  Endo/Heme/Allergies: Does not bruise/bleed easily.  Psychiatric/Behavioral: Negative for depression. The patient is not nervous/anxious.     Vitals:   Vitals:   06/07/16 0918  BP: 134/80  Pulse: (!) 55  Temp: 98 F (36.7 C)  TempSrc: Oral  SpO2: 99%  Weight: 185 lb 3.2 oz (84 kg)  Height: 5\' 6"  (1.676 m)     Body mass index is 29.89 kg/m.  Physical Exam:   Physical Exam No results found for this or any previous visit.  Assessment and Plan:   ***

## 2016-06-08 LAB — HEPATITIS C ANTIBODY: HCV Ab: NEGATIVE

## 2016-06-08 LAB — HIV ANTIBODY (ROUTINE TESTING W REFLEX): HIV 1&2 Ab, 4th Generation: NONREACTIVE

## 2016-07-23 DIAGNOSIS — M9905 Segmental and somatic dysfunction of pelvic region: Secondary | ICD-10-CM | POA: Diagnosis not present

## 2016-07-23 DIAGNOSIS — M9902 Segmental and somatic dysfunction of thoracic region: Secondary | ICD-10-CM | POA: Diagnosis not present

## 2016-07-23 DIAGNOSIS — M9903 Segmental and somatic dysfunction of lumbar region: Secondary | ICD-10-CM | POA: Diagnosis not present

## 2016-07-23 DIAGNOSIS — M791 Myalgia: Secondary | ICD-10-CM | POA: Diagnosis not present

## 2016-07-25 DIAGNOSIS — M9905 Segmental and somatic dysfunction of pelvic region: Secondary | ICD-10-CM | POA: Diagnosis not present

## 2016-07-25 DIAGNOSIS — M9902 Segmental and somatic dysfunction of thoracic region: Secondary | ICD-10-CM | POA: Diagnosis not present

## 2016-07-25 DIAGNOSIS — M791 Myalgia: Secondary | ICD-10-CM | POA: Diagnosis not present

## 2016-07-25 DIAGNOSIS — M9903 Segmental and somatic dysfunction of lumbar region: Secondary | ICD-10-CM | POA: Diagnosis not present

## 2016-07-30 DIAGNOSIS — M791 Myalgia: Secondary | ICD-10-CM | POA: Diagnosis not present

## 2016-07-30 DIAGNOSIS — M9905 Segmental and somatic dysfunction of pelvic region: Secondary | ICD-10-CM | POA: Diagnosis not present

## 2016-07-30 DIAGNOSIS — M9902 Segmental and somatic dysfunction of thoracic region: Secondary | ICD-10-CM | POA: Diagnosis not present

## 2016-07-30 DIAGNOSIS — M9903 Segmental and somatic dysfunction of lumbar region: Secondary | ICD-10-CM | POA: Diagnosis not present

## 2016-08-26 ENCOUNTER — Telehealth: Payer: Self-pay | Admitting: Family Medicine

## 2016-08-26 NOTE — Telephone Encounter (Signed)
error 

## 2016-08-27 ENCOUNTER — Encounter: Payer: Self-pay | Admitting: Family Medicine

## 2016-08-28 ENCOUNTER — Other Ambulatory Visit: Payer: Self-pay | Admitting: Surgical

## 2016-08-28 DIAGNOSIS — F410 Panic disorder [episodic paroxysmal anxiety] without agoraphobia: Secondary | ICD-10-CM

## 2016-08-28 MED ORDER — DIAZEPAM 2 MG PO TABS: ORAL_TABLET | ORAL | 0 refills | Status: DC

## 2017-01-29 ENCOUNTER — Encounter: Payer: Self-pay | Admitting: Family Medicine

## 2017-01-29 ENCOUNTER — Ambulatory Visit (INDEPENDENT_AMBULATORY_CARE_PROVIDER_SITE_OTHER): Payer: BLUE CROSS/BLUE SHIELD | Admitting: Family Medicine

## 2017-01-29 VITALS — BP 138/80 | HR 67 | Temp 97.9°F | Ht 66.0 in | Wt 179.4 lb

## 2017-01-29 DIAGNOSIS — Z1231 Encounter for screening mammogram for malignant neoplasm of breast: Secondary | ICD-10-CM

## 2017-01-29 DIAGNOSIS — Z1211 Encounter for screening for malignant neoplasm of colon: Secondary | ICD-10-CM

## 2017-01-29 DIAGNOSIS — D649 Anemia, unspecified: Secondary | ICD-10-CM

## 2017-01-29 DIAGNOSIS — N950 Postmenopausal bleeding: Secondary | ICD-10-CM

## 2017-01-29 LAB — CBC WITH DIFFERENTIAL/PLATELET
Basophils Absolute: 0.1 10*3/uL (ref 0.0–0.1)
Basophils Relative: 1.2 % (ref 0.0–3.0)
Eosinophils Absolute: 0.2 10*3/uL (ref 0.0–0.7)
Eosinophils Relative: 3.2 % (ref 0.0–5.0)
HCT: 40 % (ref 36.0–46.0)
Hemoglobin: 12.6 g/dL (ref 12.0–15.0)
Lymphocytes Relative: 24.3 % (ref 12.0–46.0)
Lymphs Abs: 1.5 10*3/uL (ref 0.7–4.0)
MCHC: 31.6 g/dL (ref 30.0–36.0)
MCV: 78.1 fl (ref 78.0–100.0)
Monocytes Absolute: 0.4 10*3/uL (ref 0.1–1.0)
Monocytes Relative: 6.1 % (ref 3.0–12.0)
Neutro Abs: 4.1 10*3/uL (ref 1.4–7.7)
Neutrophils Relative %: 65.2 % (ref 43.0–77.0)
Platelets: 331 10*3/uL (ref 150.0–400.0)
RBC: 5.12 Mil/uL — ABNORMAL HIGH (ref 3.87–5.11)
RDW: 17.2 % — ABNORMAL HIGH (ref 11.5–15.5)
WBC: 6.2 10*3/uL (ref 4.0–10.5)

## 2017-01-29 NOTE — Progress Notes (Addendum)
Katherine Hicks is a 52 y.o. female is here for follow up.  History of Present Illness:   HPI: See Assessment and Plan section for Problem Based Charting of issues discussed today.  Health Maintenance Due  Topic Date Due  . PAP SMEAR  01/22/2013  . MAMMOGRAM  12/08/2013  . COLONOSCOPY  12/08/2013  . INFLUENZA VACCINE  08/14/2016   Depression screen PHQ 2/9 06/07/2016  Decreased Interest 0  Down, Depressed, Hopeless 0  PHQ - 2 Score 0   PMHx, SurgHx, SocialHx, FamHx, Medications, and Allergies were reviewed in the Visit Navigator and updated as appropriate.  There are no active problems to display for this patient.  Social History   Tobacco Use  . Smoking status: Never Smoker  . Smokeless tobacco: Never Used  Substance Use Topics  . Alcohol use: Yes    Alcohol/week: 1.8 oz    Types: 3 Cans of beer per week  . Drug use: No   Current Medications and Allergies:   .  diazepam (VALIUM) 2 MG tablet, Take 1 tablet 30 minutes prior to dental appointment, Disp: 2 tablet, Rfl: 0  No Known Allergies   Review of Systems   Pertinent items are noted in the HPI. Otherwise, ROS is negative.  Vitals:   Vitals:   01/29/17 0956  BP: 138/80  Pulse: 67  Temp: 97.9 F (36.6 C)  TempSrc: Oral  SpO2: 97%  Weight: 179 lb 6.4 oz (81.4 kg)  Height: 5\' 6"  (1.676 m)     Body mass index is 28.96 kg/m.   Physical Exam:   Physical Exam  Constitutional: She appears well-nourished.  HENT:  Head: Normocephalic and atraumatic.  Eyes: EOM are normal. Pupils are equal, round, and reactive to light.  Neck: Normal range of motion. Neck supple.  Cardiovascular: Normal rate, regular rhythm, normal heart sounds and intact distal pulses.  Pulmonary/Chest: Effort normal.  Abdominal: Soft.  Skin: Skin is warm.  Psychiatric: She has a normal mood and affect. Her behavior is normal.  Nursing note and vitals reviewed.   Assessment and Plan:   Diagnoses and all orders for this  visit:  Post-menopausal bleeding Comments: New. The patient has noted vaginal bleeding over the past few months. We reviewed the importance of following up on this issue. She has a difficult time with medical or dental procedures (needs Valium for dental visits). Will hold off on exam today and refer to GYN. We discussed what she should expect. Orders: -     Ambulatory referral to Gynecology  Anemia, unspecified type Comments:  CBC Latest Ref Rng & Units 01/29/2017 06/07/2016  WBC 4.0 - 10.5 K/uL 6.2 5.8  Hemoglobin 12.0 - 15.0 g/dL 12.6 11.1(L)  Hematocrit 36.0 - 46.0 % 40.0 34.9(L)  Platelets 150.0 - 400.0 K/uL 331.0 310.0   Lab Results  Component Value Date   IRON 44 (L) 01/29/2017   TIBC 520 (H) 01/29/2017   FERRITIN 8 (L) 01/29/2017   Patient to take iron supplements.   Orders: -     CBC with Differential/Platelet -     Iron, TIBC and Ferritin Panel  Encounter for screening mammogram for malignant neoplasm of breast -     MM Digital Screening; Future  Screening for malignant neoplasm of colon -     HM COLONOSCOPY  . Reviewed expectations re: course of current medical issues. . Discussed self-management of symptoms. . Outlined signs and symptoms indicating need for more acute intervention. . Patient verbalized understanding and all questions  were answered. Marland Kitchen Health Maintenance issues including appropriate healthy diet, exercise, and smoking avoidance were discussed with patient. . See orders for this visit as documented in the electronic medical record. . Patient received an After Visit Summary.  Briscoe Deutscher, DO , [facility] 02/02/2017  No future appointments.

## 2017-01-30 LAB — IRON,TIBC AND FERRITIN PANEL
%SAT: 8 % (calc) — ABNORMAL LOW (ref 11–50)
Ferritin: 8 ng/mL — ABNORMAL LOW (ref 10–232)
Iron: 44 ug/dL — ABNORMAL LOW (ref 45–160)
TIBC: 520 mcg/dL (calc) — ABNORMAL HIGH (ref 250–450)

## 2017-02-02 HISTORY — PX: COLONOSCOPY: SHX174

## 2017-02-07 ENCOUNTER — Other Ambulatory Visit (HOSPITAL_COMMUNITY)
Admission: RE | Admit: 2017-02-07 | Discharge: 2017-02-07 | Disposition: A | Discharge status: Home / Self Care | Payer: BLUE CROSS/BLUE SHIELD | Source: Ambulatory Visit | Attending: Obstetrics and Gynecology | Admitting: Obstetrics and Gynecology

## 2017-02-07 ENCOUNTER — Encounter: Payer: Self-pay | Admitting: Obstetrics and Gynecology

## 2017-02-07 ENCOUNTER — Ambulatory Visit (INDEPENDENT_AMBULATORY_CARE_PROVIDER_SITE_OTHER): Payer: BLUE CROSS/BLUE SHIELD | Admitting: Obstetrics and Gynecology

## 2017-02-07 ENCOUNTER — Other Ambulatory Visit: Payer: Self-pay

## 2017-02-07 VITALS — BP 114/60 | HR 60 | Resp 14 | Ht 66.0 in | Wt 182.2 lb

## 2017-02-07 DIAGNOSIS — N950 Postmenopausal bleeding: Secondary | ICD-10-CM | POA: Insufficient documentation

## 2017-02-07 DIAGNOSIS — N841 Polyp of cervix uteri: Secondary | ICD-10-CM | POA: Insufficient documentation

## 2017-02-07 DIAGNOSIS — Z124 Encounter for screening for malignant neoplasm of cervix: Secondary | ICD-10-CM | POA: Insufficient documentation

## 2017-02-07 DIAGNOSIS — N852 Hypertrophy of uterus: Secondary | ICD-10-CM | POA: Diagnosis not present

## 2017-02-07 NOTE — Progress Notes (Signed)
GYNECOLOGY  VISIT   HPI: 52 y.o.   Single  Caucasian  female   [id number] with Patient's last menstrual period was 01/15/2015 (approximate).   here for a consultation from Dr Juleen China for postmenopausal bleeding. She has had light bleeding/spotting most days since October. No pain. Recently she has some lower abdominal "tightness", no pain. No bowel or bladder issues. Not currently sexually active.  LMP was about 2 years ago. Had vasomotor symptoms for the first year, currently better.  Last pap was in 2010. No h/o abnormal pap smears.   GYNECOLOGIC HISTORY: Patient's last menstrual period was 01/15/2015 (approximate). Contraception:Postmenopausal/same sex partner Menopausal hormone therapy: none        OB History    Gravida Para Term Preterm AB Living   0 0 0 0 0 0   SAB TAB Ectopic Multiple Live Births   0 0 0 0 0         There are no active problems to display for this patient.   Past Medical History:  Diagnosis Date  . Anemia   . Infertility, female     No past surgical history on file.  Current Outpatient Medications  Medication Sig Dispense Refill  . Ascorbic Acid (VITAMIN C) 1000 MG tablet Take 1,000 mg by mouth daily.    Marland Kitchen b complex vitamins tablet Take 1 tablet by mouth daily.    . Magnesium 400 MG CAPS Take 1 capsule by mouth daily.    . Magnesium Bisglycinate (MAG GLYCINATE PO) Take 1 tablet by mouth daily.    . Melatonin 10 MG CAPS Take 1 capsule by mouth daily.    . Omega-3 Fatty Acids (OMEGA 3 500 PO) Take 1 capsule by mouth daily.    Marland Kitchen UNABLE TO FIND Med Name:Ionic Irone 22mg , 1po daily    . vitamin A 10000 UNIT capsule Take 10,000 Units by mouth daily.    . Zinc 22.5 MG TABS Take 1 capsule by mouth daily.     No current facility-administered medications for this visit.      ALLERGIES: Patient has no known allergies.  Family History  Problem Relation Age of Onset  . Hypertension Mother   . Stroke Mother   . Cancer Father   . Diabetes Brother   .  Hyperlipidemia Brother   . Hypertension Brother   . Stroke Brother   . Heart disease Maternal Grandmother   . Hypertension Maternal Grandmother   . Stroke Maternal Grandmother   . Hypertension Paternal Grandmother   . Stroke Paternal Grandmother   . Hypertension Paternal Grandfather   . Stroke Paternal Grandfather   . Stroke Brother     Social History   Socioeconomic History  . Marital status: Single    Spouse name: Not on file  . Number of children: Not on file  . Years of education: Not on file  . Highest education level: Not on file  Social Needs  . Financial resource strain: Not on file  . Food insecurity - worry: Not on file  . Food insecurity - inability: Not on file  . Transportation needs - medical: Not on file  . Transportation needs - non-medical: Not on file  Occupational History  . Not on file  Tobacco Use  . Smoking status: Never Smoker  . Smokeless tobacco: Never Used  Substance and Sexual Activity  . Alcohol use: Yes    Alcohol/week: 1.8 oz    Types: 3 Cans of beer per week  . Drug use: No  .  Sexual activity: Not Currently    Birth control/protection: None  Other Topics Concern  . Not on file  Social History Narrative  . Not on file  She works in Press photographer for [employer], and is a Games developer, builds cabinets   Review of Systems  Constitutional: Negative.   HENT: Negative.   Eyes: Negative.   Respiratory: Negative.   Cardiovascular: Negative.   Gastrointestinal: Negative.   Genitourinary: Negative.   Musculoskeletal: Negative.   Skin: Negative.   Neurological: Negative.   Endo/Heme/Allergies: Negative.   Psychiatric/Behavioral: Negative.     PHYSICAL EXAMINATION:    BP 114/60 (BP Location: Right Arm, Patient Position: Sitting, Cuff Size: Normal)   Pulse 60   Resp 14   Ht 5\' 6"  (1.676 m)   Wt 182 lb 3.2 oz (82.6 kg)   LMP 01/15/2015 (Approximate)   BMI 29.41 kg/m     General appearance: alert, cooperative and appears stated age Neck: no  adenopathy, supple, symmetrical, trachea midline and thyroid normal to inspection and palpation Breasts: normal appearance, no masses or tenderness Heart: regular rate and rhythm Lungs: CTAB Abdomen: soft, non-tender; bowel sounds normal; no masses,  no organomegaly Extremities: normal, atraumatic, no cyanosis Skin: normal color, texture and turgor, no rashes or lesions Lymph: normal cervical supraclavicular and inguinal nodes Neurologic: grossly normal   Pelvic: External genitalia:  no lesions              Urethra:  normal appearing urethra with no masses, tenderness or lesions              Bartholins and Skenes: normal                 Vagina: normal appearing vagina with normal color and discharge, no lesions              Cervix: large cervical polyp, removed with ringed forceps              Bimanual Exam:  Uterus:  slightly enlarged, irregular, suspect fibroids, mobile, not tender              Adnexa: no mass, fullness, tenderness              Rectovaginal: Yes.  .  Confirms.              Anus:  normal sphincter tone, no lesions  Chaperone was present for exam.  ASSESSMENT Postmenopausal bleeding Cervical polyp Enlarged uterus (denies h/o fibroids)    PLAN Cervical polyp removed Pap with hpv Return for ultrasound (will start with abdominal ultrasound, tight introitus) She is doing a mammogram through Dr Alcario Drought office    An After Visit Summary was printed and given to the patient.   CC: Dr Juleen China Note sent

## 2017-02-11 ENCOUNTER — Encounter: Payer: Self-pay | Admitting: Obstetrics and Gynecology

## 2017-02-11 ENCOUNTER — Other Ambulatory Visit: Payer: Self-pay

## 2017-02-11 ENCOUNTER — Ambulatory Visit (INDEPENDENT_AMBULATORY_CARE_PROVIDER_SITE_OTHER): Payer: BLUE CROSS/BLUE SHIELD | Admitting: Obstetrics and Gynecology

## 2017-02-11 ENCOUNTER — Ambulatory Visit (INDEPENDENT_AMBULATORY_CARE_PROVIDER_SITE_OTHER): Payer: BLUE CROSS/BLUE SHIELD

## 2017-02-11 ENCOUNTER — Other Ambulatory Visit: Payer: Self-pay | Admitting: Obstetrics and Gynecology

## 2017-02-11 VITALS — BP 116/62 | HR 64 | Resp 16 | Ht 66.0 in | Wt 182.0 lb

## 2017-02-11 DIAGNOSIS — N840 Polyp of corpus uteri: Secondary | ICD-10-CM | POA: Diagnosis not present

## 2017-02-11 DIAGNOSIS — N852 Hypertrophy of uterus: Secondary | ICD-10-CM

## 2017-02-11 DIAGNOSIS — D259 Leiomyoma of uterus, unspecified: Secondary | ICD-10-CM

## 2017-02-11 DIAGNOSIS — R9389 Abnormal findings on diagnostic imaging of other specified body structures: Secondary | ICD-10-CM

## 2017-02-11 DIAGNOSIS — N950 Postmenopausal bleeding: Secondary | ICD-10-CM

## 2017-02-11 NOTE — Progress Notes (Signed)
GYNECOLOGY  VISIT   HPI: 52 y.o.   Single  Caucasian  female   G0P0000 with Patient's last menstrual period was 01/15/2015 (approximate).   here for further evaluation of PMP bleeding. Last week she was seen here for the first time. She had a cervical polyp removed and was noted to have an enlarged uterus on exam. The path on the poly and a pap are pending.   GYNECOLOGIC HISTORY: Patient's last menstrual period was 01/15/2015 (approximate). Contraception:Postmenopausal Menopausal hormone therapy: none        OB History    Gravida Para Term Preterm AB Living   0 0 0 0 0 0   SAB TAB Ectopic Multiple Live Births   0 0 0 0 0         There are no active problems to display for this patient.   Past Medical History:  Diagnosis Date  . Anemia   . Infertility, female     No past surgical history on file.  Current Outpatient Medications  Medication Sig Dispense Refill  . Ascorbic Acid (VITAMIN C) 1000 MG tablet Take 1,000 mg by mouth daily.    Marland Kitchen b complex vitamins tablet Take 1 tablet by mouth daily.    . Magnesium 400 MG CAPS Take 1 capsule by mouth daily.    . Magnesium Bisglycinate (MAG GLYCINATE PO) Take 1 tablet by mouth daily.    . Melatonin 10 MG CAPS Take 1 capsule by mouth daily.    . Omega-3 Fatty Acids (OMEGA 3 500 PO) Take 1 capsule by mouth daily.    Marland Kitchen UNABLE TO FIND Med Name:Ionic Irone 22mg , 1po daily    . vitamin A 10000 UNIT capsule Take 10,000 Units by mouth daily.    . Zinc 22.5 MG TABS Take 1 capsule by mouth daily.     No current facility-administered medications for this visit.      ALLERGIES: Patient has no known allergies.  Family History  Problem Relation Age of Onset  . Hypertension Mother   . Stroke Mother   . Cancer Father   . Diabetes Brother   . Hyperlipidemia Brother   . Hypertension Brother   . Stroke Brother   . Heart disease Maternal Grandmother   . Hypertension Maternal Grandmother   . Stroke Maternal Grandmother   . Hypertension  Paternal Grandmother   . Stroke Paternal Grandmother   . Hypertension Paternal Grandfather   . Stroke Paternal Grandfather   . Stroke Brother     Social History   Socioeconomic History  . Marital status: Single    Spouse name: Not on file  . Number of children: Not on file  . Years of education: Not on file  . Highest education level: Not on file  Social Needs  . Financial resource strain: Not on file  . Food insecurity - worry: Not on file  . Food insecurity - inability: Not on file  . Transportation needs - medical: Not on file  . Transportation needs - non-medical: Not on file  Occupational History  . Not on file  Tobacco Use  . Smoking status: Never Smoker  . Smokeless tobacco: Never Used  Substance and Sexual Activity  . Alcohol use: Yes    Alcohol/week: 1.8 oz    Types: 3 Cans of beer per week  . Drug use: No  . Sexual activity: Not Currently    Birth control/protection: None  Other Topics Concern  . Not on file  Social History Narrative  .  Not on file    Review of Systems  Constitutional: Negative.   HENT: Negative.   Eyes: Negative.   Respiratory: Negative.   Cardiovascular: Negative.   Gastrointestinal: Negative.   Genitourinary: Negative.   Musculoskeletal: Negative.   Skin: Negative.   Neurological: Negative.   Endo/Heme/Allergies: Negative.   Psychiatric/Behavioral: Negative.     PHYSICAL EXAMINATION:    BP 116/62 (BP Location: Right Arm, Patient Position: Sitting, Cuff Size: Normal)   Pulse 64   Resp 16   Ht 5\' 6"  (1.676 m)   Wt 182 lb (82.6 kg)   LMP 01/15/2015 (Approximate)   BMI 29.38 kg/m     General appearance: alert, cooperative and appears stated age  Pelvic: External genitalia:  no lesions              Urethra:  normal appearing urethra with no masses, tenderness or lesions              Bartholins and Skenes: normal                 Vagina: normal appearing vagina with normal color and discharge, no lesions              Cervix:  no lesions                Sonohysterogram The procedure and risks of the procedure were reviewed with the patient, consent form was signed. A speculum was placed in the vagina and the cervix was cleansed with betadine. The sonohysterogram catheter was inserted into the uterine cavity without difficulty. Saline was infused under direct observation with the ultrasound. At least 2 endometrial polyps were noted at the fundus. Normal endocervical canal. The catheter was removed.     Chaperone was present for exam.  ASSESSMENT Postmenopausal bleeding Fibroid uterus Endometrial polyps Last week a cervical polyp was removed and a pap was done, results are pending    PLAN Plan: hysteroscopy, polypectomy, dilation and curettage. Reviewed risks, including: bleeding, infection, uterine perforation, fluid overload, need for further sugery    An After Visit Summary was printed and given to the patient.  ~15 minutes face to face time of which over 50% was spent in counseling.   CC: Dr Juleen China

## 2017-02-12 ENCOUNTER — Encounter: Payer: Self-pay | Admitting: Obstetrics and Gynecology

## 2017-02-12 LAB — CYTOLOGY - PAP
Diagnosis: NEGATIVE
HPV (WINDOPATH): NOT DETECTED

## 2017-02-17 ENCOUNTER — Other Ambulatory Visit: Payer: Self-pay | Admitting: Family Medicine

## 2017-02-17 DIAGNOSIS — Z1231 Encounter for screening mammogram for malignant neoplasm of breast: Secondary | ICD-10-CM

## 2017-02-18 ENCOUNTER — Telehealth: Payer: Self-pay | Admitting: Obstetrics and Gynecology

## 2017-02-18 NOTE — Telephone Encounter (Signed)
Spoke with patient regarding benefit for recommended surgery. Patient understood and agreeable. Patient has confirmed and is ready to proceed with scheduling. Patient aware this is professional benefit only. Patient aware will be contacted by hospital for separate benefits. Forwarding to Conservation officer, historic buildings for scheduling.   Routing to Lamont Snowball, RN

## 2017-03-06 ENCOUNTER — Ambulatory Visit
Admission: RE | Admit: 2017-03-06 | Discharge: 2017-03-06 | Disposition: A | Discharge status: Home / Self Care | Payer: BLUE CROSS/BLUE SHIELD | Source: Ambulatory Visit | Attending: Family Medicine | Admitting: Family Medicine

## 2017-03-06 DIAGNOSIS — Z1231 Encounter for screening mammogram for malignant neoplasm of breast: Secondary | ICD-10-CM

## 2017-03-06 NOTE — Telephone Encounter (Signed)
Call from patient. Surgery date options discussed.  States would like first available date. Will schedule and call her back.

## 2017-03-06 NOTE — Telephone Encounter (Signed)
Call to patient. Advised surgery scheduled for 03-18-17 at 1130 at [facility]. Surgery instruction sheet reviewed and printed copy will be provided with surgery center brochure at consult appointment tomorrow.   Routing to provider for final review. Patient agreeable to disposition. Will close encounter.

## 2017-03-06 NOTE — Telephone Encounter (Signed)
Call to patient. Left message to call back regarding scheduling procedure.

## 2017-03-07 ENCOUNTER — Encounter: Payer: Self-pay | Admitting: Obstetrics and Gynecology

## 2017-03-07 ENCOUNTER — Other Ambulatory Visit: Payer: Self-pay

## 2017-03-07 ENCOUNTER — Encounter (HOSPITAL_BASED_OUTPATIENT_CLINIC_OR_DEPARTMENT_OTHER): Payer: Self-pay

## 2017-03-07 ENCOUNTER — Ambulatory Visit (INDEPENDENT_AMBULATORY_CARE_PROVIDER_SITE_OTHER): Payer: BLUE CROSS/BLUE SHIELD | Admitting: Obstetrics and Gynecology

## 2017-03-07 VITALS — BP 112/80 | HR 60 | Resp 14 | Wt 177.0 lb

## 2017-03-07 DIAGNOSIS — N950 Postmenopausal bleeding: Secondary | ICD-10-CM

## 2017-03-07 DIAGNOSIS — N840 Polyp of corpus uteri: Secondary | ICD-10-CM

## 2017-03-07 NOTE — H&P (Signed)
GYNECOLOGY  VISIT   HPI: 52 y.o.   Single  Caucasian  female   G0P0000 with Patient's last menstrual period was 01/15/2015 (approximate).   here for surgery consult. Patient is scheduled for D&C hysteroscopy 03-18-17. The patient presented with PMP bleeding.  A cervical polyp was removed and she was noted to have an enlarged uterus on exam. She returned for an ultrasound, had a thickened stripe, and several fibroids (largest 4 cm) sonohysterogram revealed at least 2 endometrial polyps.   GYNECOLOGIC HISTORY: Patient's last menstrual period was 01/15/2015 (approximate). Contraception:postmenopause Menopausal hormone therapy: none         OB History    Gravida Para Term Preterm AB Living   0 0 0 0 0 0   SAB TAB Ectopic Multiple Live Births   0 0 0 0 0         There are no active problems to display for this patient.   Past Medical History:  Diagnosis Date  . Anemia   . Cervical polyp   . Infertility, female   . PMB (postmenopausal bleeding)     Past Surgical History:  Procedure Laterality Date  . COLONOSCOPY  02/02/2017    Current Outpatient Medications  Medication Sig Dispense Refill  . Ascorbic Acid (VITAMIN C) 1000 MG tablet Take 1,000 mg by mouth daily.    Marland Kitchen b complex vitamins tablet Take 1 tablet by mouth daily.    . Magnesium 400 MG CAPS Take 1 capsule by mouth daily.    . Magnesium Bisglycinate (MAG GLYCINATE PO) Take 1 tablet by mouth daily.    . Melatonin 10 MG CAPS Take 1 capsule by mouth daily.    . Omega-3 Fatty Acids (OMEGA 3 500 PO) Take 1 capsule by mouth daily.    Marland Kitchen UNABLE TO FIND Med Name:Ionic Irone 22mg , 1po daily    . vitamin A 10000 UNIT capsule Take 10,000 Units by mouth daily.    . Zinc 22.5 MG TABS Take 1 capsule by mouth daily.     No current facility-administered medications for this visit.      ALLERGIES: Patient has no known allergies.  Family History  Problem Relation Age of Onset  . Hypertension Mother   . Stroke Mother   . Cancer  Father   . Diabetes Brother   . Hyperlipidemia Brother   . Hypertension Brother   . Stroke Brother   . Heart disease Maternal Grandmother   . Hypertension Maternal Grandmother   . Stroke Maternal Grandmother   . Hypertension Paternal Grandmother   . Stroke Paternal Grandmother   . Hypertension Paternal Grandfather   . Stroke Paternal Grandfather   . Stroke Brother     Social History   Socioeconomic History  . Marital status: Single    Spouse name: Not on file  . Number of children: Not on file  . Years of education: Not on file  . Highest education level: Not on file  Social Needs  . Financial resource strain: Not on file  . Food insecurity - worry: Not on file  . Food insecurity - inability: Not on file  . Transportation needs - medical: Not on file  . Transportation needs - non-medical: Not on file  Occupational History  . Not on file  Tobacco Use  . Smoking status: Never Smoker  . Smokeless tobacco: Never Used  Substance and Sexual Activity  . Alcohol use: Yes    Alcohol/week: 1.8 oz    Types: 3 Cans of beer  per week  . Drug use: No  . Sexual activity: Not Currently    Birth control/protection: None  Other Topics Concern  . Not on file  Social History Narrative  . Not on file    Review of Systems  Constitutional: Negative.   HENT: Negative.   Eyes: Negative.   Respiratory: Negative.   Cardiovascular: Negative.   Gastrointestinal: Negative.   Genitourinary: Negative.   Musculoskeletal: Negative.   Skin: Negative.   Neurological: Negative.   Endo/Heme/Allergies: Negative.   Psychiatric/Behavioral: Negative.     PHYSICAL EXAMINATION:    BP 112/80 (BP Location: Right Arm, Patient Position: Sitting, Cuff Size: Normal)   Pulse 60   Resp 14   Wt 177 lb (80.3 kg)   LMP 01/15/2015 (Approximate)   BMI 28.57 kg/m     General appearance: alert, cooperative and appears stated age Neck: no adenopathy, supple, symmetrical, trachea midline and thyroid normal  to inspection and palpation Heart: regular rate and rhythm Lungs: CTAB Abdomen: soft, non-tender; bowel sounds normal; no masses,  no organomegaly Extremities: normal, atraumatic, no cyanosis Skin: normal color, texture and turgor, no rashes or lesions Lymph: normal cervical supraclavicular and inguinal nodes Neurologic: grossly normal   ASSESSMENT PMP bleeding, endometrial polyps    PLAN Plan: hysteroscopy, polypectomy, dilation and curettage. Reviewed risks, including: bleeding, infection, uterine perforation, fluid overload, need for further sugery    An After Visit Summary was printed and given to the patient.

## 2017-03-07 NOTE — Progress Notes (Signed)
GYNECOLOGY  VISIT   HPI: 52 y.o.   Single  Caucasian  female   G0P0000 with Patient's last menstrual period was 01/15/2015 (approximate).   here for surgery consult. Patient is scheduled for D&C hysteroscopy 03-18-17. The patient presented with PMP bleeding.  A cervical polyp was removed and she was noted to have an enlarged uterus on exam. She returned for an ultrasound, had a thickened stripe, and several fibroids (largest 4 cm) sonohysterogram revealed at least 2 endometrial polyps.   GYNECOLOGIC HISTORY: Patient's last menstrual period was 01/15/2015 (approximate). Contraception:postmenopause Menopausal hormone therapy: none         OB History    Gravida Para Term Preterm AB Living   0 0 0 0 0 0   SAB TAB Ectopic Multiple Live Births   0 0 0 0 0         There are no active problems to display for this patient.   Past Medical History:  Diagnosis Date  . Anemia   . Cervical polyp   . Infertility, female   . PMB (postmenopausal bleeding)     Past Surgical History:  Procedure Laterality Date  . COLONOSCOPY  02/02/2017    Current Outpatient Medications  Medication Sig Dispense Refill  . Ascorbic Acid (VITAMIN C) 1000 MG tablet Take 1,000 mg by mouth daily.    Marland Kitchen b complex vitamins tablet Take 1 tablet by mouth daily.    . Magnesium 400 MG CAPS Take 1 capsule by mouth daily.    . Magnesium Bisglycinate (MAG GLYCINATE PO) Take 1 tablet by mouth daily.    . Melatonin 10 MG CAPS Take 1 capsule by mouth daily.    . Omega-3 Fatty Acids (OMEGA 3 500 PO) Take 1 capsule by mouth daily.    Marland Kitchen UNABLE TO FIND Med Name:Ionic Irone 22mg , 1po daily    . vitamin A 10000 UNIT capsule Take 10,000 Units by mouth daily.    . Zinc 22.5 MG TABS Take 1 capsule by mouth daily.     No current facility-administered medications for this visit.      ALLERGIES: Patient has no known allergies.  Family History  Problem Relation Age of Onset  . Hypertension Mother   . Stroke Mother   . Cancer  Father   . Diabetes Brother   . Hyperlipidemia Brother   . Hypertension Brother   . Stroke Brother   . Heart disease Maternal Grandmother   . Hypertension Maternal Grandmother   . Stroke Maternal Grandmother   . Hypertension Paternal Grandmother   . Stroke Paternal Grandmother   . Hypertension Paternal Grandfather   . Stroke Paternal Grandfather   . Stroke Brother     Social History   Socioeconomic History  . Marital status: Single    Spouse name: Not on file  . Number of children: Not on file  . Years of education: Not on file  . Highest education level: Not on file  Social Needs  . Financial resource strain: Not on file  . Food insecurity - worry: Not on file  . Food insecurity - inability: Not on file  . Transportation needs - medical: Not on file  . Transportation needs - non-medical: Not on file  Occupational History  . Not on file  Tobacco Use  . Smoking status: Never Smoker  . Smokeless tobacco: Never Used  Substance and Sexual Activity  . Alcohol use: Yes    Alcohol/week: 1.8 oz    Types: 3 Cans of beer  per week  . Drug use: No  . Sexual activity: Not Currently    Birth control/protection: None  Other Topics Concern  . Not on file  Social History Narrative  . Not on file    Review of Systems  Constitutional: Negative.   HENT: Negative.   Eyes: Negative.   Respiratory: Negative.   Cardiovascular: Negative.   Gastrointestinal: Negative.   Genitourinary: Negative.   Musculoskeletal: Negative.   Skin: Negative.   Neurological: Negative.   Endo/Heme/Allergies: Negative.   Psychiatric/Behavioral: Negative.     PHYSICAL EXAMINATION:    BP 112/80 (BP Location: Right Arm, Patient Position: Sitting, Cuff Size: Normal)   Pulse 60   Resp 14   Wt 177 lb (80.3 kg)   LMP 01/15/2015 (Approximate)   BMI 28.57 kg/m     General appearance: alert, cooperative and appears stated age Neck: no adenopathy, supple, symmetrical, trachea midline and thyroid normal  to inspection and palpation Heart: regular rate and rhythm Lungs: CTAB Abdomen: soft, non-tender; bowel sounds normal; no masses,  no organomegaly Extremities: normal, atraumatic, no cyanosis Skin: normal color, texture and turgor, no rashes or lesions Lymph: normal cervical supraclavicular and inguinal nodes Neurologic: grossly normal   ASSESSMENT PMP bleeding, endometrial polyps    PLAN Plan: hysteroscopy, polypectomy, dilation and curettage. Reviewed risks, including: bleeding, infection, uterine perforation, fluid overload, need for further sugery    An After Visit Summary was printed and given to the patient.

## 2017-03-10 ENCOUNTER — Encounter (HOSPITAL_BASED_OUTPATIENT_CLINIC_OR_DEPARTMENT_OTHER): Payer: Self-pay | Admitting: *Deleted

## 2017-03-10 ENCOUNTER — Other Ambulatory Visit: Payer: Self-pay

## 2017-03-10 NOTE — Progress Notes (Signed)
SPOKE W/ PT VIA PHONE FOR PRE-OP INTERVIEW.  NPO AFTER MN.  ARRIVE AT 0945.  NEEDS HG.

## 2017-03-18 ENCOUNTER — Ambulatory Visit (HOSPITAL_BASED_OUTPATIENT_CLINIC_OR_DEPARTMENT_OTHER)
Admission: RE | Admit: 2017-03-18 | Discharge: 2017-03-18 | Disposition: A | Discharge status: Home / Self Care | Payer: BLUE CROSS/BLUE SHIELD | Source: Ambulatory Visit | Attending: Obstetrics and Gynecology | Admitting: Obstetrics and Gynecology

## 2017-03-18 ENCOUNTER — Encounter (HOSPITAL_BASED_OUTPATIENT_CLINIC_OR_DEPARTMENT_OTHER)
Admission: RE | Disposition: A | Discharge status: Home / Self Care | Payer: Self-pay | Source: Ambulatory Visit | Attending: Obstetrics and Gynecology

## 2017-03-18 ENCOUNTER — Other Ambulatory Visit: Payer: Self-pay

## 2017-03-18 ENCOUNTER — Ambulatory Visit (HOSPITAL_BASED_OUTPATIENT_CLINIC_OR_DEPARTMENT_OTHER): Payer: BLUE CROSS/BLUE SHIELD | Admitting: Anesthesiology

## 2017-03-18 ENCOUNTER — Encounter (HOSPITAL_BASED_OUTPATIENT_CLINIC_OR_DEPARTMENT_OTHER): Payer: Self-pay | Admitting: *Deleted

## 2017-03-18 DIAGNOSIS — N840 Polyp of corpus uteri: Secondary | ICD-10-CM | POA: Insufficient documentation

## 2017-03-18 DIAGNOSIS — N950 Postmenopausal bleeding: Secondary | ICD-10-CM | POA: Diagnosis not present

## 2017-03-18 DIAGNOSIS — N841 Polyp of cervix uteri: Secondary | ICD-10-CM | POA: Diagnosis not present

## 2017-03-18 DIAGNOSIS — D259 Leiomyoma of uterus, unspecified: Secondary | ICD-10-CM | POA: Insufficient documentation

## 2017-03-18 HISTORY — PX: DILATATION & CURETTAGE/HYSTEROSCOPY WITH MYOSURE: SHX6511

## 2017-03-18 HISTORY — DX: Postmenopausal bleeding: N95.0

## 2017-03-18 HISTORY — DX: Polyp of corpus uteri: N84.0

## 2017-03-18 LAB — POCT HEMOGLOBIN-HEMACUE: HEMOGLOBIN: 12.3 g/dL (ref 12.0–15.0)

## 2017-03-18 SURGERY — DILATATION & CURETTAGE/HYSTEROSCOPY WITH MYOSURE
Anesthesia: General

## 2017-03-18 MED ORDER — LIDOCAINE 2% (20 MG/ML) 5 ML SYRINGE
INTRAMUSCULAR | Status: DC | PRN
  Administered 2017-03-18: 60 mg via INTRAVENOUS

## 2017-03-18 MED ORDER — KETOROLAC TROMETHAMINE 30 MG/ML IJ SOLN
30.0000 mg | Freq: Once | INTRAMUSCULAR | Status: DC | PRN
  Filled 2017-03-18: qty 1

## 2017-03-18 MED ORDER — KETOROLAC TROMETHAMINE 30 MG/ML IJ SOLN
INTRAMUSCULAR | Status: DC | PRN
  Administered 2017-03-18: 30 mg via INTRAVENOUS

## 2017-03-18 MED ORDER — ROCURONIUM BROMIDE 10 MG/ML (PF) SYRINGE
PREFILLED_SYRINGE | INTRAVENOUS | Status: AC
  Filled 2017-03-18: qty 5

## 2017-03-18 MED ORDER — FENTANYL CITRATE (PF) 100 MCG/2ML IJ SOLN
INTRAMUSCULAR | Status: AC
  Filled 2017-03-18: qty 2

## 2017-03-18 MED ORDER — LIDOCAINE 2% (20 MG/ML) 5 ML SYRINGE
INTRAMUSCULAR | Status: AC
  Filled 2017-03-18: qty 5

## 2017-03-18 MED ORDER — PROPOFOL 10 MG/ML IV BOLUS
INTRAVENOUS | Status: AC
  Filled 2017-03-18: qty 20

## 2017-03-18 MED ORDER — DEXAMETHASONE SODIUM PHOSPHATE 10 MG/ML IJ SOLN
INTRAMUSCULAR | Status: DC | PRN
  Administered 2017-03-18: 10 mg via INTRAVENOUS

## 2017-03-18 MED ORDER — ONDANSETRON HCL 4 MG/2ML IJ SOLN
INTRAMUSCULAR | Status: AC
  Filled 2017-03-18: qty 2

## 2017-03-18 MED ORDER — MIDAZOLAM HCL 2 MG/2ML IJ SOLN
INTRAMUSCULAR | Status: AC
  Filled 2017-03-18: qty 2

## 2017-03-18 MED ORDER — MIDAZOLAM HCL 2 MG/2ML IJ SOLN
INTRAMUSCULAR | Status: DC | PRN
  Administered 2017-03-18: 2 mg via INTRAVENOUS

## 2017-03-18 MED ORDER — DEXAMETHASONE SODIUM PHOSPHATE 10 MG/ML IJ SOLN
INTRAMUSCULAR | Status: AC
Start: 2017-03-18 — End: 2017-03-18
  Filled 2017-03-18: qty 1

## 2017-03-18 MED ORDER — LACTATED RINGERS IV SOLN
INTRAVENOUS | Status: DC
  Administered 2017-03-18: 10:00:00 via INTRAVENOUS
  Filled 2017-03-18: qty 1000

## 2017-03-18 MED ORDER — PROPOFOL 10 MG/ML IV BOLUS
INTRAVENOUS | Status: DC | PRN
  Administered 2017-03-18: 50 mg via INTRAVENOUS
  Administered 2017-03-18: 170 mg via INTRAVENOUS

## 2017-03-18 MED ORDER — FENTANYL CITRATE (PF) 100 MCG/2ML IJ SOLN
INTRAMUSCULAR | Status: AC
Start: 2017-03-18 — End: 2017-03-18
  Filled 2017-03-18: qty 2

## 2017-03-18 MED ORDER — LACTATED RINGERS IV SOLN
INTRAVENOUS | Status: DC
  Administered 2017-03-18 (×2): via INTRAVENOUS
  Filled 2017-03-18: qty 1000

## 2017-03-18 MED ORDER — FENTANYL CITRATE (PF) 100 MCG/2ML IJ SOLN
25.0000 ug | INTRAMUSCULAR | Status: DC | PRN
  Administered 2017-03-18: 50 ug via INTRAVENOUS
  Filled 2017-03-18: qty 1

## 2017-03-18 MED ORDER — FENTANYL CITRATE (PF) 100 MCG/2ML IJ SOLN
INTRAMUSCULAR | Status: DC | PRN
  Administered 2017-03-18 (×2): 50 ug via INTRAVENOUS

## 2017-03-18 MED ORDER — PROMETHAZINE HCL 25 MG/ML IJ SOLN
6.2500 mg | INTRAMUSCULAR | Status: DC | PRN
  Filled 2017-03-18: qty 1

## 2017-03-18 MED ORDER — KETOROLAC TROMETHAMINE 30 MG/ML IJ SOLN
INTRAMUSCULAR | Status: AC
  Filled 2017-03-18: qty 1

## 2017-03-18 MED ORDER — ONDANSETRON HCL 4 MG/2ML IJ SOLN
INTRAMUSCULAR | Status: DC | PRN
  Administered 2017-03-18: 4 mg via INTRAVENOUS

## 2017-03-18 SURGICAL SUPPLY — 22 items
CANISTER SUCT 3000ML PPV (MISCELLANEOUS) ×4 IMPLANT
CATH ROBINSON RED A/P 16FR (CATHETERS) ×2 IMPLANT
COUNTER NEEDLE 1200 MAGNETIC (NEEDLE) ×2 IMPLANT
DEVICE MYOSURE LITE (MISCELLANEOUS) IMPLANT
DEVICE MYOSURE REACH (MISCELLANEOUS) ×2 IMPLANT
DILATOR CANAL MILEX (MISCELLANEOUS) IMPLANT
FILTER ARTHROSCOPY CONVERTOR (FILTER) ×2 IMPLANT
GLOVE BIO SURGEON STRL SZ 6.5 (GLOVE) ×2 IMPLANT
GOWN STRL REUS W/TWL LRG LVL3 (GOWN DISPOSABLE) ×2 IMPLANT
IV NS IRRIG 3000ML ARTHROMATIC (IV SOLUTION) ×2 IMPLANT
KIT TURNOVER CYSTO (KITS) ×2 IMPLANT
MYOSURE XL FIBROID REM (MISCELLANEOUS)
PACK VAGINAL MINOR WOMEN LF (CUSTOM PROCEDURE TRAY) ×2 IMPLANT
PAD OB MATERNITY 4.3X12.25 (PERSONAL CARE ITEMS) ×2 IMPLANT
PAD PREP 24X48 CUFFED NSTRL (MISCELLANEOUS) ×2 IMPLANT
SEAL ROD LENS SCOPE MYOSURE (ABLATOR) ×2 IMPLANT
SYR 20CC LL (SYRINGE) IMPLANT
SYSTEM TISS REMOVAL MYSR XL RM (MISCELLANEOUS) IMPLANT
TOWEL OR 17X24 6PK STRL BLUE (TOWEL DISPOSABLE) ×4 IMPLANT
TUBING AQUILEX INFLOW (TUBING) ×2 IMPLANT
TUBING AQUILEX OUTFLOW (TUBING) ×2 IMPLANT
WATER STERILE IRR 500ML POUR (IV SOLUTION) IMPLANT

## 2017-03-18 NOTE — Anesthesia Procedure Notes (Signed)
Procedure Name: LMA Insertion Date/Time: 03/18/2017 12:54 PM Performed by: Wanita Chamberlain, CRNA Pre-anesthesia Checklist: Patient identified, Timeout performed, Emergency Drugs available, Suction available and Patient being monitored Patient Re-evaluated:Patient Re-evaluated prior to induction Oxygen Delivery Method: Circle system utilized Preoxygenation: Pre-oxygenation with 100% oxygen Induction Type: IV induction Ventilation: Mask ventilation without difficulty LMA: LMA inserted LMA Size: 4.0 Number of attempts: 1 Placement Confirmation: breath sounds checked- equal and bilateral,  CO2 detector and positive ETCO2 Tube secured with: Tape Dental Injury: Teeth and Oropharynx as per pre-operative assessment

## 2017-03-18 NOTE — Discharge Instructions (Signed)
Next dose motrin ,aleve after 7 pm today  Post Anesthesia Home Care Instructions  Activity: Get plenty of rest for the remainder of the day. A responsible individual must stay with you for 24 hours following the procedure.  For the next 24 hours, DO NOT: -Drive a car -Paediatric nurse -Drink alcoholic beverages -Take any medication unless instructed by your physician -Make any legal decisions or sign important papers.  Meals: Start with liquid foods such as gelatin or soup. Progress to regular foods as tolerated. Avoid greasy, spicy, heavy foods. If nausea and/or vomiting occur, drink only clear liquids until the nausea and/or vomiting subsides. Call your physician if vomiting continues.  Special Instructions/Symptoms: Your throat may feel dry or sore from the anesthesia or the breathing tube placed in your throat during surgery. If this causes discomfort, gargle with warm salt water. The discomfort should disappear within 24 hours.  If you had a scopolamine patch placed behind your ear for the management of post- operative nausea and/or vomiting:  1. The medication in the patch is effective for 72 hours, after which it should be removed.  Wrap patch in a tissue and discard in the trash. Wash hands thoroughly with soap and water. 2. You may remove the patch earlier than 72 hours if you experience unpleasant side effects which may include dry mouth, dizziness or visual disturbances. 3. Avoid touching the patch. Wash your hands with soap and water after contact with the patch.

## 2017-03-18 NOTE — Transfer of Care (Signed)
Immediate Anesthesia Transfer of Care Note  Patient: Katherine Hicks  Procedure(s) Performed: DILATATION & CURETTAGE/HYSTEROSCOPY WITH MYOSURE (N/A )  Patient Location: PACU  Anesthesia Type:General  Level of Consciousness: awake, alert , oriented and patient cooperative  Airway & Oxygen Therapy: Patient Spontanous Breathing and Patient connected to nasal cannula oxygen  Post-op Assessment: Report given to RN and Post -op Vital signs reviewed and stable  Post vital signs: Reviewed and stable  Last Vitals:  Vitals:   03/18/17 0932  BP: 114/68  Pulse: (!) 59  Resp: 16  Temp: 37 C  SpO2: 100%    Last Pain:  Vitals:   03/18/17 0955  TempSrc:   PainSc: 3       Patients Stated Pain Goal: 6 (69/45/03 8882)  Complications: No apparent anesthesia complications

## 2017-03-18 NOTE — Op Note (Signed)
Preoperative Diagnosis: Postmenopausal bleeding, endometrial polyps  Postoperative Diagnosis: same  Procedure: Hysteroscopy, polypectomy, dilation and curettage  Surgeon: Dr Sumner Boast  Assistants: None  Anesthesia: General via LMA  EBL: 5 cc  Fluids: 700 cc LR  Fluid deficit: 225 cc  Urine output: not recorded  Indications for surgery: The patient is a 52 yo female, who presented with postmenopausal bleeding. Work up included a normal pap, removal of a benign endocervical polyp and endometrial polyps on sonohysterogram. She was also noted to have fibroids, but none were noted to be deviating her cavity.  The risks of the surgery were reviewed with the patient and the consent form was signed prior to her surgery.  Findings: EUA: slightly enlarged, irregularly shaped uterus. Hysteroscopy: 2 endometrial polyps, otherwise thin endometrium  Specimens: Endometrial polyps, endometrial curettage.    Procedure: The patient was taken to the operating room with an IV in place. She was placed in the dorsal lithotomy position and anesthesia was administered. She was prepped and draped in the usual sterile fashion for a vaginal procedure. She was I&O catheterized. A sims retractor was placed in the vagina and a single tooth tenaculum was placed on the anterior lip of the cervix. The cervix was dilated to a #7 hagar dilator. The uterus was sounded to 8-9 cm. The myosure hysteroscope was inserted into the uterine cavity. With continuous infusion of normal saline, the uterine cavity was visualized with the above findings. The myosure reach was used to resect the polyps. The myosure was then removed. The cavity was then curetted with the small sharp curette. The cavity had the characteristically gritty texture at the end of the procedure. The curette and the single tooth tenaculum were removed. Slight oozing from the tenaculum site was stopped with pressure. The speculum was removed. The patients  perineum was cleansed of betadine and she was taken out of the dorsal lithotomy position.  Upon awakening the LMA was removed and the patient was transferred to the recovery room in stable and awake condition.  The sponge and instrument count were correct. There were no complications.

## 2017-03-18 NOTE — Interval H&P Note (Signed)
History and Physical Interval Note:  03/18/2017 12:32 PM  Katherine Hicks  has presented today for surgery, with the diagnosis of PMB, endometrial polyp  The various methods of treatment have been discussed with the patient and family. After consideration of risks, benefits and other options for treatment, the patient has consented to  Procedure(s): [place] (N/A) as a surgical intervention .  The patient's history has been reviewed, patient examined, no change in status, stable for surgery.  I have reviewed the patient's chart and labs.  Questions were answered to the patient's satisfaction.     Salvadore Dom

## 2017-03-18 NOTE — Anesthesia Postprocedure Evaluation (Signed)
Anesthesia Post Note  Patient: Katherine Hicks  Procedure(s) Performed: DILATATION & CURETTAGE/HYSTEROSCOPY WITH MYOSURE (N/A )     Patient location during evaluation: PACU Anesthesia Type: General Level of consciousness: awake and alert Pain management: pain level controlled Vital Signs Assessment: post-procedure vital signs reviewed and stable Respiratory status: spontaneous breathing, nonlabored ventilation, respiratory function stable and patient connected to nasal cannula oxygen Cardiovascular status: blood pressure returned to baseline and stable Postop Assessment: no apparent nausea or vomiting Anesthetic complications: no    Last Vitals:  Vitals:   03/18/17 0932 03/18/17 1334  BP: 114/68 121/75  Pulse: (!) 59 75  Resp: 16 15  Temp: 37 C (!) 36.3 C  SpO2: 100% 100%    Last Pain:  Vitals:   03/18/17 0955  TempSrc:   PainSc: 3                  Anthonia Monger S

## 2017-03-18 NOTE — Anesthesia Preprocedure Evaluation (Addendum)
Anesthesia Evaluation  Patient identified by MRN, date of birth, ID band Patient awake    Reviewed: Allergy & Precautions, NPO status , Patient's Chart, lab work & pertinent test results  Airway Mallampati: II  TM Distance: >3 FB Neck ROM: Full    Dental no notable dental hx. (+) Teeth Intact, Dental Advisory Given   Pulmonary neg pulmonary ROS,    Pulmonary exam normal breath sounds clear to auscultation       Cardiovascular negative cardio ROS Normal cardiovascular exam Rhythm:Regular Rate:Normal     Neuro/Psych negative neurological ROS  negative psych ROS   GI/Hepatic negative GI ROS, Neg liver ROS,   Endo/Other  negative endocrine ROS  Renal/GU negative Renal ROS  negative genitourinary   Musculoskeletal negative musculoskeletal ROS (+)   Abdominal   Peds negative pediatric ROS (+)  Hematology negative hematology ROS (+)   Anesthesia Other Findings   Reproductive/Obstetrics negative OB ROS                            Anesthesia Physical Anesthesia Plan  ASA: II  Anesthesia Plan: General   Post-op Pain Management:    Induction: Intravenous  PONV Risk Score and Plan: 3 and Ondansetron, Dexamethasone and Treatment may vary due to age or medical condition  Airway Management Planned: LMA  Additional Equipment:   Intra-op Plan:   Post-operative Plan: Extubation in OR  Informed Consent: I have reviewed the patients History and Physical, chart, labs and discussed the procedure including the risks, benefits and alternatives for the proposed anesthesia with the patient or authorized representative who has indicated his/her understanding and acceptance.   Dental advisory given  Plan Discussed with: CRNA and Surgeon  Anesthesia Plan Comments:         Anesthesia Quick Evaluation

## 2017-03-19 ENCOUNTER — Encounter (HOSPITAL_BASED_OUTPATIENT_CLINIC_OR_DEPARTMENT_OTHER): Payer: Self-pay | Admitting: Obstetrics and Gynecology

## 2017-04-03 ENCOUNTER — Encounter: Payer: Self-pay | Admitting: Obstetrics and Gynecology

## 2017-04-03 ENCOUNTER — Ambulatory Visit (INDEPENDENT_AMBULATORY_CARE_PROVIDER_SITE_OTHER): Payer: BLUE CROSS/BLUE SHIELD | Admitting: Obstetrics and Gynecology

## 2017-04-03 ENCOUNTER — Other Ambulatory Visit: Payer: Self-pay

## 2017-04-03 VITALS — BP 104/62 | HR 56 | Ht 66.0 in | Wt 179.2 lb

## 2017-04-03 DIAGNOSIS — Z9889 Other specified postprocedural states: Secondary | ICD-10-CM

## 2017-04-03 DIAGNOSIS — N950 Postmenopausal bleeding: Secondary | ICD-10-CM | POA: Diagnosis not present

## 2017-04-03 NOTE — Progress Notes (Signed)
GYNECOLOGY  VISIT   HPI: 53 y.o.   Single  Caucasian  female   [id number] with Patient's last menstrual period was 01/15/2015 (approximate).   here for 2 week follow up from hysteroscopy, polypectomy, D&C for PMP bleeding. Pathology was benign. She is feeling well, no c/o. She spotted off and on for a week, nothing since then.  Diagnosis 1. Endometrium, curettage - BENIGN ENDOMETRIAL AND ENDOCERVICAL MUCOSA. - NEGATIVE FOR HYPERPLASIA OR MALIGNANCY. 2. Endometrial polyp - BENIGN ENDOMETRIAL POLYP. - BENIGN BACKGROUND INACTIVE ENDOMETRIUM. - NEGATIVE FOR HYPERPLASIA OR MALIGNANCY. - SCANT FRAGMENTS OF UNREMARKABLE MYOMETRIUM.  GYNECOLOGIC HISTORY: Patient's last menstrual period was 01/15/2015 (approximate). Contraception:Postmenopausal Menopausal hormone therapy: none        OB History    Gravida  0   Para  0   Term  0   Preterm  0   AB  0   Living  0     SAB  0   TAB  0   Ectopic  0   Multiple  0   Live Births  0              There are no active problems to display for this patient.   Past Medical History:  Diagnosis Date  . Anemia   . Endometrial polyp   . PMB (postmenopausal bleeding)     Past Surgical History:  Procedure Laterality Date  . COLONOSCOPY  02/02/2017  . DILATATION & CURETTAGE/HYSTEROSCOPY WITH MYOSURE N/A 03/18/2017   Procedure: DILATATION & CURETTAGE/HYSTEROSCOPY WITH MYOSURE;  Surgeon: Salvadore Dom, MD;  Location: [facility];  Service: Gynecology;  Laterality: N/A;    Current Outpatient Medications  Medication Sig Dispense Refill  . Ascorbic Acid (VITAMIN C) 1000 MG tablet Take 1,000 mg by mouth daily.    Marland Kitchen b complex vitamins tablet Take 1 tablet by mouth daily.    . Magnesium 400 MG CAPS Take 1 capsule by mouth daily.    . Omega-3 Fatty Acids (OMEGA 3 500 PO) Take 1 capsule by mouth daily.    Marland Kitchen UNABLE TO FIND Med Name:Ionic Irone 22mg , 1po daily    . vitamin A 10000 UNIT capsule Take 10,000 Units by mouth  daily.    . Zinc 22.5 MG TABS Take 1 capsule by mouth daily.     No current facility-administered medications for this visit.      ALLERGIES: Patient has no known allergies.  Family History  Problem Relation Age of Onset  . Hypertension Mother   . Stroke Mother   . Cancer Father   . Diabetes Brother   . Hyperlipidemia Brother   . Hypertension Brother   . Stroke Brother   . Heart disease Maternal Grandmother   . Hypertension Maternal Grandmother   . Stroke Maternal Grandmother   . Hypertension Paternal Grandmother   . Stroke Paternal Grandmother   . Hypertension Paternal Grandfather   . Stroke Paternal Grandfather   . Stroke Brother     Social History   Socioeconomic History  . Marital status: Single    Spouse name: Not on file  . Number of children: Not on file  . Years of education: Not on file  . Highest education level: Not on file  Occupational History  . Not on file  Social Needs  . Financial resource strain: Not on file  . Food insecurity:    Worry: Not on file    Inability: Not on file  . Transportation needs:    Medical:  Not on file    Non-medical: Not on file  Tobacco Use  . Smoking status: Never Smoker  . Smokeless tobacco: Never Used  Substance and Sexual Activity  . Alcohol use: Yes    Alcohol/week: 0.6 oz    Types: 1 Cans of beer per week  . Drug use: No  . Sexual activity: Not on file  Lifestyle  . Physical activity:    Days per week: Not on file    Minutes per session: Not on file  . Stress: Not on file  Relationships  . Social connections:    Talks on phone: Not on file    Gets together: Not on file    Attends religious service: Not on file    Active member of club or organization: Not on file    Attends meetings of clubs or organizations: Not on file    Relationship status: Not on file  . Intimate partner violence:    Fear of current or ex partner: Not on file    Emotionally abused: Not on file    Physically abused: Not on file     Forced sexual activity: Not on file  Other Topics Concern  . Not on file  Social History Narrative  . Not on file    Review of Systems  Constitutional: Negative.   HENT: Negative.   Eyes: Negative.   Respiratory: Negative.   Cardiovascular: Negative.   Gastrointestinal: Negative.   Genitourinary: Negative.   Musculoskeletal: Negative.   Skin: Negative.   Neurological: Negative.   Endo/Heme/Allergies: Negative.   Psychiatric/Behavioral: Negative.     PHYSICAL EXAMINATION:    BP 104/62 (BP Location: Right Arm, Patient Position: Sitting, Cuff Size: Normal)   Pulse (!) 56   Ht 5\' 6"  (1.676 m)   Wt 179 lb 3.2 oz (81.3 kg)   LMP 01/15/2015 (Approximate)   BMI 28.92 kg/m     General appearance: alert, cooperative and appears stated age Abdomen: soft, non-tender; non distended, no masses,  no organomegaly   ASSESSMENT Doing well s/p hysteroscopy, polypectomy, D&C. Pathology was benign, endometrium atrophic.    PLAN Call with any further bleeding Routine F/U   An After Visit Summary was printed and given to the patient.   CC: Dr Juleen China

## 2017-11-22 ENCOUNTER — Encounter: Payer: Self-pay | Admitting: Family Medicine

## 2017-11-24 ENCOUNTER — Encounter: Payer: Self-pay | Admitting: Family Medicine

## 2017-11-24 NOTE — Telephone Encounter (Signed)
Please see message and advise. Pt not seen by you since 01/2017.

## 2017-11-25 DIAGNOSIS — F902 Attention-deficit hyperactivity disorder, combined type: Secondary | ICD-10-CM | POA: Diagnosis not present

## 2017-11-26 DIAGNOSIS — F902 Attention-deficit hyperactivity disorder, combined type: Secondary | ICD-10-CM | POA: Diagnosis not present

## 2017-12-24 DIAGNOSIS — F902 Attention-deficit hyperactivity disorder, combined type: Secondary | ICD-10-CM | POA: Diagnosis not present

## 2018-03-18 DIAGNOSIS — F902 Attention-deficit hyperactivity disorder, combined type: Secondary | ICD-10-CM | POA: Diagnosis not present

## 2018-05-12 DIAGNOSIS — F902 Attention-deficit hyperactivity disorder, combined type: Secondary | ICD-10-CM | POA: Diagnosis not present

## 2018-06-22 DIAGNOSIS — F902 Attention-deficit hyperactivity disorder, combined type: Secondary | ICD-10-CM | POA: Diagnosis not present

## 2018-08-31 DIAGNOSIS — F902 Attention-deficit hyperactivity disorder, combined type: Secondary | ICD-10-CM | POA: Diagnosis not present

## 2018-11-13 DIAGNOSIS — Z20828 Contact with and (suspected) exposure to other viral communicable diseases: Secondary | ICD-10-CM | POA: Diagnosis not present

## 2019-05-18 ENCOUNTER — Other Ambulatory Visit: Payer: Self-pay | Admitting: Obstetrics and Gynecology

## 2019-12-20 ENCOUNTER — Ambulatory Visit: Payer: Self-pay | Attending: Internal Medicine

## 2019-12-20 DIAGNOSIS — Z23 Encounter for immunization: Secondary | ICD-10-CM

## 2019-12-20 NOTE — Progress Notes (Signed)
   Covid-19 Vaccination Clinic  Name:  Katherine Hicks    MRN: [id number] DOB: 1966-01-07  12/20/2019  [name] was observed post Covid-19 immunization for 15 minutes without incident. She was provided with Vaccine Information Sheet and instruction to access the V-Safe system.   [name] was instructed to call 911 with any severe reactions post vaccine: Marland Kitchen Difficulty breathing  . Swelling of face and throat  . A fast heartbeat  . A bad rash all over body  . Dizziness and weakness   Immunizations Administered    No immunizations on file.

## 2020-06-24 ENCOUNTER — Telehealth: Payer: Self-pay | Admitting: Orthopedic Surgery

## 2020-06-24 DIAGNOSIS — U071 COVID-19: Secondary | ICD-10-CM

## 2020-06-24 MED ORDER — BENZONATATE 100 MG PO CAPS: 100.0000 mg | ORAL_CAPSULE | Freq: Two times a day (BID) | ORAL | 0 refills | Status: AC | PRN

## 2020-06-24 MED ORDER — MOLNUPIRAVIR EUA 200MG CAPSULE: 4.0000 | ORAL_CAPSULE | Freq: Two times a day (BID) | ORAL | 0 refills | Status: AC

## 2020-06-24 MED ORDER — ALBUTEROL SULFATE HFA 108 (90 BASE) MCG/ACT IN AERS: 2.0000 | INHALATION_SPRAY | Freq: Four times a day (QID) | RESPIRATORY_TRACT | 0 refills | Status: AC | PRN

## 2020-06-24 NOTE — Progress Notes (Signed)
E-Visit for Positive Covid Test Result We are sorry you are not feeling well. We are here to help!  You have tested positive for COVID-19, meaning that you were infected with the novel coronavirus and could give the virus to others.  It is vitally important that you stay home so you do not spread it to others.      Positive results from the at-home tests are very reliable. I do not think you need a PCR test.  If your shortness of breath gets worse you should be evaluated in an emergency department.  Please continue isolation at home, for at least 10 days since the start of your symptoms and until you have had 24 hours with no fever (without taking a fever reducer) and with improving of symptoms.  If you have no symptoms but tested positive (or all symptoms resolve after 5 days and you have no fever) you can leave your house but continue to wear a mask around others for an additional 5 days. If you have a fever,continue to stay home until you have had 24 hours of no fever. Most cases improve 5-10 days from onset but we have seen a small number of patients who have gotten worse after the 10 days.  Please be sure to watch for worsening symptoms and remain taking the proper precautions.   Go to the nearest hospital ED for assessment if fever/cough/breathlessness are severe or illness seems like a threat to life.    The following symptoms may appear 2-14 days after exposure: Fever Cough Shortness of breath or difficulty breathing Chills Repeated shaking with chills Muscle pain Headache Sore throat New loss of taste or smell Fatigue Congestion or runny nose Nausea or vomiting Diarrhea  You have been enrolled in [facility] for COVID-19. Daily you will receive a questionnaire within the [facility] website. Our COVID-19 response team will be monitoring your responses daily.  You can use medication such as prescription cough medication called Tessalon Perles 100 mg. You may take 1-2  capsules every 8 hours as needed for cough and  prescription inhaler called Albuterol MDI 90 mcg /actuation 2 puffs every 4 hours as needed for shortness of breath, wheezing, cough  I have also prescribed molnupiravir  You may also take acetaminophen (Tylenol) as needed for fever.  HOME CARE: Only take medications as instructed by your medical team. Drink plenty of fluids and get plenty of rest. A steam or ultrasonic humidifier can help if you have congestion.   GET HELP RIGHT AWAY IF YOU HAVE EMERGENCY WARNING SIGNS.  Call 911 or proceed to your closest emergency facility if: You develop worsening high fever. Trouble breathing Bluish lips or face Persistent pain or pressure in the chest New confusion Inability to wake or stay awake You cough up blood. Your symptoms become more severe Inability to hold down food or fluids  This list is not all possible symptoms. Contact your medical provider for any symptoms that are severe or concerning to you.    Your e-visit answers were reviewed by a board certified advanced clinical practitioner to complete your personal care plan.  Depending on the condition, your plan could have included both over the counter or prescription medications.  If there is a problem please reply once you have received a response from your provider.  Your safety is important to [place].  If you have drug allergies check your prescription carefully.    You can use MyChart to ask questions about today's visit,  request a non-urgent call back, or ask for a work or school excuse for 24 hours related to this e-Visit. If it has been greater than 24 hours you will need to follow up with your provider, or enter a new e-Visit to address those concerns. You will get an e-mail in the next two days asking about your experience.  I hope that your e-visit has been valuable and will speed your recovery. Thank you for using e-visits.   Greater than 5 minutes, yet less than 10 minutes of  time have been spent researching, coordinating and implementing care for this patient today.
